# Patient Record
Sex: Male | Born: 1950 | Race: White | Hispanic: No | Marital: Married | Smoking: Never smoker
Health system: Southern US, Community
[De-identification: ages and names within clinical notes are randomized; demographics above are authoritative.]

## PROBLEM LIST (undated history)

## (undated) DIAGNOSIS — I10 Essential (primary) hypertension: Secondary | ICD-10-CM

## (undated) DIAGNOSIS — C189 Malignant neoplasm of colon, unspecified: Secondary | ICD-10-CM

## (undated) DIAGNOSIS — I2510 Atherosclerotic heart disease of native coronary artery without angina pectoris: Secondary | ICD-10-CM

## (undated) DIAGNOSIS — E785 Hyperlipidemia, unspecified: Secondary | ICD-10-CM

## (undated) DIAGNOSIS — I517 Cardiomegaly: Secondary | ICD-10-CM

## (undated) HISTORY — PX: COLECTOMY: SHX59

## (undated) HISTORY — DX: Hyperlipidemia, unspecified: E78.5

## (undated) HISTORY — PX: POLYPECTOMY: SHX149

## (undated) HISTORY — PX: COLONOSCOPY: SHX174

## (undated) HISTORY — DX: Malignant neoplasm of colon, unspecified: C18.9

## (undated) HISTORY — DX: Atherosclerotic heart disease of native coronary artery without angina pectoris: I25.10

---

## 2005-03-22 ENCOUNTER — Ambulatory Visit: Payer: Self-pay | Admitting: Internal Medicine

## 2005-09-28 ENCOUNTER — Ambulatory Visit: Payer: Self-pay | Admitting: Family Medicine

## 2006-07-17 ENCOUNTER — Ambulatory Visit: Payer: Self-pay | Admitting: Internal Medicine

## 2006-07-30 ENCOUNTER — Ambulatory Visit: Payer: Self-pay | Admitting: Internal Medicine

## 2007-01-02 ENCOUNTER — Ambulatory Visit: Payer: Self-pay | Admitting: Gastroenterology

## 2007-01-17 ENCOUNTER — Ambulatory Visit: Payer: Self-pay | Admitting: Gastroenterology

## 2007-01-17 ENCOUNTER — Encounter: Payer: Self-pay | Admitting: Gastroenterology

## 2007-10-21 ENCOUNTER — Ambulatory Visit: Payer: Self-pay | Admitting: Family Medicine

## 2007-10-21 DIAGNOSIS — J309 Allergic rhinitis, unspecified: Secondary | ICD-10-CM | POA: Insufficient documentation

## 2008-06-18 DIAGNOSIS — Z85038 Personal history of other malignant neoplasm of large intestine: Secondary | ICD-10-CM

## 2008-06-18 HISTORY — DX: Personal history of other malignant neoplasm of large intestine: Z85.038

## 2009-01-06 ENCOUNTER — Ambulatory Visit: Payer: Self-pay | Admitting: Internal Medicine

## 2009-01-06 DIAGNOSIS — Z85038 Personal history of other malignant neoplasm of large intestine: Secondary | ICD-10-CM | POA: Insufficient documentation

## 2009-01-06 DIAGNOSIS — E785 Hyperlipidemia, unspecified: Secondary | ICD-10-CM

## 2009-01-06 DIAGNOSIS — L570 Actinic keratosis: Secondary | ICD-10-CM | POA: Insufficient documentation

## 2009-01-08 ENCOUNTER — Encounter: Payer: Self-pay | Admitting: Internal Medicine

## 2009-07-01 ENCOUNTER — Telehealth: Payer: Self-pay | Admitting: Internal Medicine

## 2009-12-09 ENCOUNTER — Encounter (INDEPENDENT_AMBULATORY_CARE_PROVIDER_SITE_OTHER): Payer: Self-pay | Admitting: *Deleted

## 2010-01-24 ENCOUNTER — Encounter (INDEPENDENT_AMBULATORY_CARE_PROVIDER_SITE_OTHER): Payer: Self-pay | Admitting: *Deleted

## 2010-03-13 ENCOUNTER — Telehealth: Payer: Self-pay | Admitting: Gastroenterology

## 2010-04-11 ENCOUNTER — Encounter (INDEPENDENT_AMBULATORY_CARE_PROVIDER_SITE_OTHER): Payer: Self-pay | Admitting: *Deleted

## 2010-05-03 ENCOUNTER — Encounter (INDEPENDENT_AMBULATORY_CARE_PROVIDER_SITE_OTHER): Payer: Self-pay | Admitting: *Deleted

## 2010-05-05 ENCOUNTER — Ambulatory Visit: Payer: Self-pay | Admitting: Gastroenterology

## 2010-05-25 ENCOUNTER — Ambulatory Visit: Payer: Self-pay | Admitting: Gastroenterology

## 2010-05-28 ENCOUNTER — Encounter: Payer: Self-pay | Admitting: Gastroenterology

## 2010-07-20 NOTE — Procedures (Signed)
Summary: Colonoscopy  Patient: James Krueger Note: All result statuses are Final unless otherwise noted.  Tests: (1) Colonoscopy (COL)   COL Colonoscopy           DONE     [facility]     [address]     [place], [place]  [address]           COLONOSCOPY PROCEDURE REPORT     PATIENT:  James Krueger, James Krueger  MR#:  [id number]     BIRTHDATE:  [birth year], 59 yrs. old  GENDER:  male     ENDOSCOPIST:  Judie Petit T. Russella Dar, MD, [facility]           PROCEDURE DATE:  05/25/2010     PROCEDURE:  Colonoscopy with snare polypectomy     ASA CLASS:  Class II     INDICATIONS:  1) surveillance and high-risk screening 2) history     of adenomatous polyps, 2008 3) history of colon cancer: T3, N2,     M0, 1996.     MEDICATIONS:   Fentanyl 50 mcg IV, Versed 6 mg IV     DESCRIPTION OF PROCEDURE:   After the risks benefits and     alternatives of the procedure were thoroughly explained, informed     consent was obtained.  Digital rectal exam was performed and     revealed no abnormalities.   The LB [id number]-[id number] endoscope     was introduced through the anus and advanced to the terminal ileum     which was intubated for a short distance, without limitations.     The quality of the prep was excellent, using MoviPrep.  The     instrument was then slowly withdrawn as the colon was fully     examined.     <<PROCEDUREIMAGES>>     FINDINGS:  The right colon was surgically resected and an     ileo-colonic anastamosis was seen.  The neoterminal ileum appeared     normal.  Two polyps were found in the rectum. They were 4 - 6 mm     in size. Polyps were snared without cautery. Retrieval was     successful. Otherwise normal colonoscopy without other polyps,     masses, vascular ectasias, or inflammatory changes.  Retroflexed     views in the rectum revealed no abnormalities.    The time to     cecum =  1.5  minutes. The scope was then withdrawn (time =  9.67     min) from the patient and the procedure completed.           COMPLICATIONS:  None           ENDOSCOPIC IMPRESSION:     1) Prior right hemi-colectomy     2) 4 - 6 mm Two polyps in the rectum           RECOMMENDATIONS:     1) Await pathology results     2) Repeat Colonoscopy in 3 years.           Venita Lick. Russella Dar, MD, [facility]           CC: Rolm Baptise, MD           n.     Rosalie DoctorVenita Lick. Stark at 05/25/2010 10:03 AM           [name], [id number]  Note: An exclamation mark (!) indicates a result that was not dispersed into the flowsheet.  Document Creation Date: 05/25/2010 10:03 AM _______________________________________________________________________  (1) Order result status: Final Collection or observation date-time: 05/25/2010 09:57 Requested date-time:  Receipt date-time:  Reported date-time:  Referring Physician:   Ordering Physician: Claudette Head [id number]) Specimen Source:  Source: Launa Grill Order Number: [id number] Lab site:   Appended Document: Colonoscopy     Procedures Next Due Date:    Colonoscopy: 05/2013

## 2010-07-20 NOTE — Letter (Signed)
Summary: Colonoscopy Letter  [facility]  [address] [place], [place] [address]   Phone: [phone]  Fax: [phone]      December 09, 2009 MRN: [id number]   James Krueger [address] [place], [place]  [address]   Dear Mr. Odonovan,   According to your medical record, it is time for you to schedule a Colonoscopy. The American Cancer Society recommends this procedure as a method to detect early colon cancer. Patients with a family history of colon cancer, or a personal history of colon polyps or inflammatory bowel disease are at increased risk.  This letter has beeen generated based on the recommendations made at the time of your procedure. If you feel that in your particular situation this may no longer apply, please contact our office.  Please call our office at [phone] to schedule this appointment or to update your records at your earliest convenience.  Thank you for cooperating with [place] to provide you with the very best care possible.   Sincerely,  Judie Petit T. Russella Dar, M.D.  [facility] Gastroenterology Division [phone]

## 2010-07-20 NOTE — Letter (Signed)
Summary: Patient Notice- Polyp Results   Gastroenterology  [address] [place], [place] [address]   Phone: [phone]  Fax: [phone]        May 28, 2010 MRN: [id number]    James Krueger [address] [place], [place]  [address]    Dear [name],  I am pleased to inform you that the colon polyp(s) removed during your recent colonoscopy was (were) found to be benign (no cancer detected) upon pathologic examination.  I recommend you have a repeat colonoscopy examination in 3 years to look for recurrent polyps, as having colon polyps increases your risk for having recurrent polyps or even colon cancer in the future.  Should you develop new or worsening symptoms of abdominal pain, bowel habit changes or bleeding from the rectum or bowels, please schedule an evaluation with either your primary care physician or with me.  Continue treatment plan as outlined the day of your exam.  Please call us if you are having persistent problems or have questions about your condition that have not been fully answered at this time.  Sincerely,  Meryl Dare MD [facility]  This letter has been electronically signed by your physician.  Appended Document: Patient Notice- Polyp Results Letter mailed

## 2010-07-20 NOTE — Progress Notes (Signed)
Summary: Rx Viagra  Phone Note Call from Patient   Caller: Patient Call For: Dr. Alphonsus Sias Summary of Call: Patient called to request Rx for Viagra.  Says he did not request it at the time of his physical on 01/06/2009 but would like to try it now.  Please advise Initial call taken by: Linde Gillis CMA Duncan Dull),  July 01, 2009 9:58 AM  Follow-up for Phone Call        okay to send Rx for viagra 100 #6 x 3 Take 1/2 -1 tab about 30 minutes before sex Follow-up by: Cindee Salt MD,  July 01, 2009 1:21 PM  Additional Follow-up for Phone Call Additional follow up Details #1::        printed rx by mistake, called pt to see which pharmacy rx needs to be sent to, left message on machine at work and home for patient to return call. at  Additional Follow-up by: Mervin Hack CMA Duncan Dull),  July 01, 2009 6:06 PM    Additional Follow-up for Phone Call Additional follow up Details #2::    spoke with patient and sent rx to pharmacy. Follow-up by: Mervin Hack CMA Duncan Dull),  July 04, 2009 8:29 AM  New/Updated Medications: VIAGRA 100 MG TABS (SILDENAFIL CITRATE) Take 1/2 -1 tab about 30 minutes before sex Prescriptions: VIAGRA 100 MG TABS (SILDENAFIL CITRATE) Take 1/2 -1 tab about 30 minutes before sex  #6 x 3   Entered by:   Mervin Hack CMA (AAMA)   Authorized by:   Cindee Salt MD   Signed by:   Mervin Hack CMA (AAMA) on 07/04/2009   Method used:   [facility] [phone]* (retail)       [address] [place], [place]  [address]       Ph: [phone] or [phone]       Fax: [phone]   RxID:   [id number] VIAGRA 100 MG TABS (SILDENAFIL CITRATE) Take 1/2 -1 tab about 30 minutes before sex  #6 x 3   Entered by:   Mervin Hack CMA (AAMA)   Authorized by:   Cindee Salt MD   Signed by:   Mervin Hack CMA (AAMA) on 07/01/2009   Method used:   Print then Give to Patient   RxID:    [id number]

## 2010-07-20 NOTE — Letter (Signed)
Summary: Pre Visit Letter Revised  [facility]  [address] [place], [place] [address]   Phone: [phone]  Fax: [phone]        04/11/2010 MRN: [id number] James Krueger [address] [place], [place]  [address]             Procedure Date:  05/25/2010   Welcome to the Gastroenterology Division at [facility].    You are scheduled to see a nurse for your pre-procedure visit on 05/05/2010 at 2:30PM on the 3rd floor at [facility], [address]  We ask that you try to arrive at our office 15 minutes prior to your appointment time to allow for check-in.  Please take a minute to review the attached form.  If you answer "Yes" to one or more of the questions on the first page, we ask that you call the person listed at your earliest opportunity.  If you answer "No" to all of the questions, please complete the rest of the form and bring it to your appointment.    Your nurse visit will consist of discussing your medical and surgical history, your immediate family medical history, and your medications.   If you are unable to list all of your medications on the form, please bring the medication bottles to your appointment and we will list them.  We will need to be aware of both prescribed and over the counter drugs.  We will need to know exact dosage information as well.    Please be prepared to read and sign documents such as consent forms, a financial agreement, and acknowledgement forms.  If necessary, and with your consent, a friend or relative is welcome to sit-in on the nurse visit with you.  Please bring your insurance card so that we may make a copy of it.  If your insurance requires a referral to see a specialist, please bring your referral form from your primary care physician.  No co-pay is required for this nurse visit.     If you cannot keep your appointment, please call [phone] to cancel or reschedule prior to your appointment date.  This  allows [place] the opportunity to schedule an appointment for another patient in need of care.    Thank you for choosing [facility] for your medical needs.  We appreciate the opportunity to care for you.  Please visit [place] at our website  to learn more about our practice.  Sincerely, The Gastroenterology Division

## 2010-07-20 NOTE — Progress Notes (Signed)
Summary: cost/quote complaint   Phone Note Call from Patient Call back at [phone] (patient's cell)   Caller: wife Call For: Dr. Russella Dar Reason for Call: Talk to Nurse Summary of Call: wife called to sch a COL for her husband in January and to find out her husband's ins coverage for a COL... told her we dont have January's sch right now, but that she could call back in late November to inquire about an appt in January... told her as for her husband's insurance, we do verify with ins as a curtesy after the pt has come in for a previsit or office visit for the COL, however we do recommend that pts contact their ins company themselves to be familiar with their particular policy and coverage benefits... again reiterated to wife that we merely repeat what their ins tells [place] and notify the pt of this as a curtesy... wife said this "wont do" as her husband needs to know an EXACT cost amount to tell his ins company before the end of October... told her a very rough margin is $1,500-3,000 with no complications or removals and in LEC... wife said "lets just say he had no complications or removals, what would the cost be?"... again told her thats what the $1500-3000 quote is... wife then asked to speak to the manager as her husband "has been a pt of Dr. Ardell Isaacs for many many years and this quote is unexceptable"... told her I would be happy to pass  this along to IT consultant, Aurea Graff... wife laughed and asked what was Joan's last name... told pt it is James Krueger... wife then said she would like Aurea Graff to call her husband instead as he is the ins Stage manager Initial call taken by: Vallarie Mare,  March 13, 2010 2:39 PM  Follow-up for Phone Call        Spoke with James Krueger this morning and agreed to have [place] verify benefits for her husband's procedure.  I will call her back afterwards. Follow-up by: James Krueger,  March 21, 2010 2:56 PM  Additional Follow-up for Phone Call Additional follow up  Details #1::        [place] verified benefits with Beth C. at [facility] on 10/4 [phone]).  Pt has a $750 deductible (unmet) after which [employer] will pay 80% up to $3000 out of pocket (zero met).  I called James Krueger around 5pm on 10/4 and gave her the benefit information.  As an example only (I stressed again that we don't know what the final charges will be), if the total charges are $3,000, they would be responsible for the $750 deductible plus 20% of the remaining $2250 balance which is $450 ... total pt responsibility estimate would be $1,200.  Final charges could be more or less than the $3,000 estimate.  James Krueger said that she understood and thanked me for giving her an idea of what they might owe so that they will know how much to put in their healthcare savings/flexible spending a/c for next year.  She also asked if they could pay any balance by installments if they need to and I said that they could.  Her husband is going to wait until January to have his procedure.  I told her to call me if they have any other questions. Additional Follow-up by: James Krueger,  March 22, 2010 8:17 AM

## 2010-07-20 NOTE — Miscellaneous (Signed)
Summary: previsit prep/rm  Clinical Lists Changes  Medications: Added new medication of MOVIPREP 100 GM  SOLR (PEG-KCL-NACL-NASULF-NA ASC-C) As per prep instructions. - Signed Rx of MOVIPREP 100 GM  SOLR (PEG-KCL-NACL-NASULF-NA ASC-C) As per prep instructions.;  #1 x 0;  Signed;  Entered by: Sherren Kerns RN;  Authorized by: Meryl Dare MD [facility];  Method used: [facility] [phone]*, [address] [place], [place], [place]  [address], Ph: [phone] or [phone], Fax: [phone] Observations: Added new observation of ALLERGY REV: Done (05/05/2010 14:25) Added new observation of NKA: T (05/05/2010 14:25)    Prescriptions: MOVIPREP 100 GM  SOLR (PEG-KCL-NACL-NASULF-NA ASC-C) As per prep instructions.  #1 x 0   Entered by:   Sherren Kerns RN   Authorized by:   Meryl Dare MD [facility]   Signed by:   Sherren Kerns RN on 05/05/2010   Method used:   [facility]ron[facility] to        [facility] [phone]* (retail)       [address] [place], [place]  [address]       Ph: [phone] or [phone]       Fax: [phone]   RxID:   [id number]

## 2010-07-20 NOTE — Letter (Signed)
Summary: [facility] Instructions  [facility]  [address] [place], [place] [address]   Phone: [phone]  Fax: [phone]       James Krueger    [birth year]    MRN: [id number]        Procedure Day /Date:  Thursday 05/25/2010     Arrival Time: 8:30 am      Procedure Time: 9:30 am     Location of Procedure:                    _x _  [facility] (4th Floor)                        PREPARATION FOR COLONOSCOPY WITH MOVIPREP   Starting 5 days prior to your procedure Saturday 12/3 do not eat nuts, seeds, popcorn, corn, beans, peas,  salads, or any raw vegetables.  Do not take any fiber supplements (e.g. Metamucil, Citrucel, and Benefiber).  THE DAY BEFORE YOUR PROCEDURE         DATE: Wednesday 12/7  1.  Drink clear liquids the entire day-NO SOLID FOOD  2.  Do not drink anything colored red or purple.  Avoid juices with pulp.  No orange juice.  3.  Drink at least 64 oz. (8 glasses) of fluid/clear liquids during the day to prevent dehydration and help the prep work efficiently.  CLEAR LIQUIDS INCLUDE: Water Jello Ice Popsicles Tea (sugar ok, no milk/cream) Powdered fruit flavored drinks Coffee (sugar ok, no milk/cream) Gatorade Juice: apple, white grape, white cranberry  Lemonade Clear bullion, consomm, broth Carbonated beverages (any kind) Strained chicken noodle soup Hard Candy                             4.  In the morning, mix first dose of MoviPrep solution:    Empty 1 Pouch A and 1 Pouch B into the disposable container    Add lukewarm drinking water to the top line of the container. Mix to dissolve    Refrigerate (mixed solution should be used within 24 hrs)  5.  Begin drinking the prep at 5:00 p.m. The MoviPrep container is divided by 4 marks.   Every 15 minutes drink the solution down to the next mark (approximately 8 oz) until the full liter is complete.   6.  Follow completed prep with 16 oz of clear liquid of your choice  (Nothing red or purple).  Continue to drink clear liquids until bedtime.  7.  Before going to bed, mix second dose of MoviPrep solution:    Empty 1 Pouch A and 1 Pouch B into the disposable container    Add lukewarm drinking water to the top line of the container. Mix to dissolve    Refrigerate  THE DAY OF YOUR PROCEDURE      DATE: Thursday 12/8  Beginning at 4:30 a.m. (5 hours before procedure):         1. Every 15 minutes, drink the solution down to the next mark (approx 8 oz) until the full liter is complete.  2. Follow completed prep with 16 oz. of clear liquid of your choice.    3. You may drink clear liquids until 7:30 am (2 HOURS BEFORE PROCEDURE).   MEDICATION INSTRUCTIONS  Unless otherwise instructed, you should take regular prescription medications with a small sip of water   as early as possible the morning of  your procedure.           OTHER INSTRUCTIONS  You will need a responsible adult at least 59 years of age to accompany you and drive you home.   This person must remain in the waiting room during your procedure.  Wear loose fitting clothing that is easily removed.  Leave jewelry and other valuables at home.  However, you may wish to bring a book to read or  an iPod/MP3 player to listen to music as you wait for your procedure to start.  Remove all body piercing jewelry and leave at home.  Total time from sign-in until discharge is approximately 2-3 hours.  You should go home directly after your procedure and rest.  You can resume normal activities the  day after your procedure.  The day of your procedure you should not:   Drive   Make legal decisions   Operate machinery   Drink alcohol   Return to work  You will receive specific instructions about eating, activities and medications before you leave.    The above instructions have been reviewed and explained to me by   Sherren Kerns RN  May 05, 2010 2:40 PM   I fully understand and  can verbalize these instructions _____________________________ Date _________

## 2010-07-20 NOTE — Letter (Signed)
Summary: Nadara Eaton letter  [place] [facility]  [address] [place], [place] [address]   Phone: [phone]  Fax: [phone]       01/24/2010 MRN: [id number]  RUSTY VILLELLA [address] [place], [place]  [address]  Dear Mr. Dann[facility] [place], and [facility] announce the retirement of Arta Silence, M.D., from full-time practice at the [facility] office effective December 15, 2009 and his plans of returning part-time.  It is important to Dr. Hetty Ely and to our practice that you understand that [facility] has seven physicians in our office for your health care needs.  We will continue to offer the same exceptional care that you have today.    Dr. Hetty Ely has spoken to many of you about his plans for retirement and returning part-time in the fall.   We will continue to work with you through the transition to schedule appointments for you in the office and meet the high standards that [facility] is committed to.   Again, it is with great pleasure that we share the news that Dr. Hetty Ely will return to [facility] in October of 2011 with a reduced schedule.    If you have any questions, or would like to request an appointment with one of our physicians, please call us at [phone] and press the option for Scheduling an appointment.  We take pleasure in providing you with excellent patient care and look forward to seeing you at your next office visit.  Our [facility] Physicians are:  Tillman Abide, M.D. Laurita Quint, M.D. Roxy Manns, M.D. Kerby Nora, M.D. Hannah Beat, M.D. Ruthe Mannan, M.D. We proudly welcomed Raechel Ache, M.D. and Eustaquio Boyden, M.D. to the practice in July/August 2011.  Sincerely,  [facility]

## 2013-03-20 ENCOUNTER — Encounter: Payer: Self-pay | Admitting: Gastroenterology

## 2013-06-08 ENCOUNTER — Encounter: Payer: Self-pay | Admitting: Gastroenterology

## 2013-06-18 DIAGNOSIS — C189 Malignant neoplasm of colon, unspecified: Secondary | ICD-10-CM

## 2013-06-18 HISTORY — DX: Malignant neoplasm of colon, unspecified: C18.9

## 2013-06-26 ENCOUNTER — Encounter: Payer: Self-pay | Admitting: Gastroenterology

## 2013-07-17 ENCOUNTER — Ambulatory Visit (AMBULATORY_SURGERY_CENTER): Payer: 59

## 2013-07-17 VITALS — Ht 67.0 in | Wt 160.0 lb

## 2013-07-17 DIAGNOSIS — Z85038 Personal history of other malignant neoplasm of large intestine: Secondary | ICD-10-CM

## 2013-07-17 MED ORDER — MOVIPREP 100 G PO SOLR
1.0000 | Freq: Once | ORAL | Status: DC
Start: 2013-07-17 — End: 2013-07-31

## 2013-07-17 NOTE — Progress Notes (Signed)
Pt declined emmi registration.  Has had colonoscopies in the past (personal hx of colon CA).

## 2013-07-21 ENCOUNTER — Encounter: Payer: Self-pay | Admitting: Gastroenterology

## 2013-07-31 ENCOUNTER — Ambulatory Visit (AMBULATORY_SURGERY_CENTER): Payer: 59 | Admitting: Gastroenterology

## 2013-07-31 ENCOUNTER — Encounter: Payer: Self-pay | Admitting: Gastroenterology

## 2013-07-31 ENCOUNTER — Telehealth: Payer: Self-pay | Admitting: Genetic Counselor

## 2013-07-31 ENCOUNTER — Other Ambulatory Visit: Payer: Self-pay

## 2013-07-31 VITALS — BP 130/80 | HR 67 | Temp 98.3°F | Resp 17 | Ht 67.0 in | Wt 160.0 lb

## 2013-07-31 DIAGNOSIS — Z86010 Personal history of colonic polyps: Secondary | ICD-10-CM

## 2013-07-31 DIAGNOSIS — Z85038 Personal history of other malignant neoplasm of large intestine: Secondary | ICD-10-CM

## 2013-07-31 DIAGNOSIS — K6389 Other specified diseases of intestine: Secondary | ICD-10-CM

## 2013-07-31 DIAGNOSIS — D126 Benign neoplasm of colon, unspecified: Secondary | ICD-10-CM

## 2013-07-31 MED ORDER — SODIUM CHLORIDE 0.9 % IV SOLN: 500.0000 mL | INTRAVENOUS | Status: DC

## 2013-07-31 NOTE — Telephone Encounter (Signed)
CALLED PATIENT TO SCHEDULE GENETIC APPT NOT ABLE TO LEAVE MESSAGE.  °

## 2013-07-31 NOTE — Patient Instructions (Signed)
YOU HAD AN ENDOSCOPIC PROCEDURE TODAY AT THE [facility]: Refer to the procedure report that was given to you for any specific questions about what was found during the examination.  If the procedure report does not answer your questions, please call your gastroenterologist to clarify.  If you requested that your care partner not be given the details of your procedure findings, then the procedure report has been included in a sealed envelope for you to review at your convenience later.  YOU SHOULD EXPECT: Some feelings of bloating in the abdomen. Passage of more gas than usual.  Walking can help get rid of the air that was put into your GI tract during the procedure and reduce the bloating. If you had a lower endoscopy (such as a colonoscopy or flexible sigmoidoscopy) you may notice spotting of blood in your stool or on the toilet paper. If you underwent a bowel prep for your procedure, then you may not have a normal bowel movement for a few days.  DIET: Your first meal following the procedure should be a light meal and then it is ok to progress to your normal diet.  A half-sandwich or bowl of soup is an example of a good first meal.  Heavy or fried foods are harder to digest and may make you feel nauseous or bloated.  Likewise meals heavy in dairy and vegetables can cause extra gas to form and this can also increase the bloating.  Drink plenty of fluids but you should avoid alcoholic beverages for 24 hours.  ACTIVITY: Your care partner should take you home directly after the procedure.  You should plan to take it easy, moving slowly for the rest of the day.  You can resume normal activity the day after the procedure however you should NOT DRIVE or use heavy machinery for 24 hours (because of the sedation medicines used during the test).    SYMPTOMS TO REPORT IMMEDIATELY: A gastroenterologist can be reached at any hour.  During normal business hours, 8:30 AM to 5:00 PM Monday through Friday,  call [phone].  After hours and on weekends, please call the GI answering service at [phone] who will take a message and have the physician on call contact you.   Following lower endoscopy (colonoscopy or flexible sigmoidoscopy):  Excessive amounts of blood in the stool  Significant tenderness or worsening of abdominal pains  Swelling of the abdomen that is new, acute  Fever of 100F or higher  FOLLOW UP: If any biopsies were taken you will be contacted by phone or by letter within the next 1-3 weeks.  Call your gastroenterologist if you have not heard about the biopsies in 3 weeks.  Our staff will call the home number listed on your records the next business day following your procedure to check on you and address any questions or concerns that you may have at that time regarding the information given to you following your procedure. This is a courtesy call and so if there is no answer at the home number and we have not heard from you through the emergency physician on call, we will assume that you have returned to your regular daily activities without incident.  SIGNATURES/CONFIDENTIALITY: You and/or your care partner have signed paperwork which will be entered into your electronic medical record.  These signatures attest to the fact that that the information above on your After Visit Summary has been reviewed and is understood.  Full responsibility of the confidentiality of this   discharge information lies with you and/or your care-partner.  Polyps, hemorrhoids-handouts given  Office will call with appointment for CT scan of chest, abd, and pelvis.  Office will also arrange for you to meet with a surgeon and for genetic testing.  Colonoscopy in 1 year.

## 2013-07-31 NOTE — Op Note (Signed)
[place]  [address]. [place], [address]   COLONOSCOPY PROCEDURE REPORT  PATIENT: James Krueger, James Krueger  MR#: [id number] BIRTHDATE: 05/09/1951 , 62  yrs. old GENDER: Male ENDOSCOPIST: Ladene Artist, MD, [facility] PROCEDURE DATE:  07/31/2013 PROCEDURE:   Colonoscopy with biopsy, snare polypectomy and submucosal tattoo injection First Screening Colonoscopy - Avg.  risk and is 50 yrs.  old or older - No.  Prior Negative Screening - Now for repeat screening. N/A  History of Adenoma - Now for follow-up colonoscopy & has been > or = to 3 yrs.  N/A  Polyps Removed Today? Yes. ASA CLASS:   Class II INDICATIONS:High risk patient with personal history of colon cancer, T3, N2 in 1996, and Patient's personal history of adenomatous colon polyps. MEDICATIONS: MAC sedation, administered by CRNA, Propofol (Diprivan), and propofol (Diprivan) 350mg  IV DESCRIPTION OF PROCEDURE:   After the risks benefits and alternatives of the procedure were thoroughly explained, informed consent was obtained.  A digital rectal exam revealed no abnormalities of the rectum.   The LB [id number]  endoscope was introduced through the anus and advanced to the surgical anastomosis. No adverse events experienced.   The quality of the prep was excellent, using MoviPrep  The instrument was then slowly withdrawn as the colon was fully examined.  COLON FINDINGS: A firm and ulcerated mass measuring 2.5 X 2cm in size was found in the ascending colon adjacent to the anastomosis. Multiple biopsies of the lesion were performed.  A tattoo was applied proximally and distally.  A sessile polyp measuring 7 mm in size was found in the ascending colon at the edge of the anastomosis. It was not removed. Multiple biopsies of the lesion were performed.  A sessile polyp measuring 2.5 cm in size was found in the sigmoid colon.  A piecemeal polypectomy was performed using snare cautery. The resection was appeared to be  complete and the polyp tissue was completely retrieved. A tattoo was applied proximally and distally.  There was evidence of a prior ileocolonic surgical anastomosis.  The colon was otherwise normal.  There was no diverticulosis, inflammation, polyps or cancers unless previously stated.  Retroflexed views revealed small internal hemorrhoids. The time to cecum=2 minutes 22 seconds. Withdrawal time=22 minutes 03 seconds. The scope was withdrawn and the procedure completed. COMPLICATIONS: There were no complications.  ENDOSCOPIC IMPRESSION: 1.   Mass measuring 2.5 X 2 cm in the ascending colon; multiple biopsies performed; a tattoo was applied 2.   Sessile polyp measuring 7 mm in the ascending colon; multiple biopsies performed 3.   Sessile polyp measuring 2.5 cm in the sigmoid colon; piecemeal polypectomy performed using snare cautery; a tattoo was applied 4.   Prior ileocolonic surgical anastomosis 5.   Small internal hemorrhoids  RECOMMENDATIONS: 1.  Await pathology results 2.  My office will arrange for you to have a CT scan of chest, abdomen and pelvis. 3.  My office will arrange for you to meet with a surgeon and for genetic testing. 4.  Colonoscopy in 1 year   eSigned:  Ladene Artist, MD, [facility] 07/31/2013 2:47 PM      PATIENT NAME:  James Krueger, James Krueger MR#: [id number]

## 2013-07-31 NOTE — Progress Notes (Signed)
Called to room to assist during endoscopic procedure.  Patient ID and intended procedure confirmed with present staff. Received instructions for my participation in the procedure from the performing physician.  

## 2013-08-03 ENCOUNTER — Telehealth: Payer: Self-pay | Admitting: *Deleted

## 2013-08-03 NOTE — Telephone Encounter (Signed)
  Follow up Call-  Call back number 07/31/2013  Post procedure Call Back phone  # [phone]  Permission to leave phone message Yes     Patient questions:  Do you have a fever, pain , or abdominal swelling? no Pain Score  0 *  Have you tolerated food without any problems? yes  Have you been able to return to your normal activities? yes  Do you have any questions about your discharge instructions: Diet   no Medications  no Follow up visit  no  Do you have questions or concerns about your Care? no  Actions: * If pain score is 4 or above: No action needed, pain <4.  Pt. Just wondered why smaller polyp was not removed,but he stated that if he had surgery than that polyp will be removed anyway.

## 2013-08-04 ENCOUNTER — Other Ambulatory Visit: Payer: 59

## 2013-08-06 ENCOUNTER — Ambulatory Visit (INDEPENDENT_AMBULATORY_CARE_PROVIDER_SITE_OTHER)
Admission: RE | Admit: 2013-08-06 | Discharge: 2013-08-06 | Disposition: A | Discharge status: Home / Self Care | Payer: 59 | Source: Ambulatory Visit | Attending: Gastroenterology | Admitting: Gastroenterology

## 2013-08-06 DIAGNOSIS — K6389 Other specified diseases of intestine: Secondary | ICD-10-CM

## 2013-08-06 MED ORDER — IOHEXOL 300 MG/ML  SOLN
100.0000 mL | Freq: Once | INTRAMUSCULAR | Status: AC | PRN
  Administered 2013-08-06: 100 mL via INTRAVENOUS

## 2013-08-07 ENCOUNTER — Telehealth: Payer: Self-pay | Admitting: Genetic Counselor

## 2013-08-07 ENCOUNTER — Telehealth: Payer: Self-pay | Admitting: Oncology

## 2013-08-07 ENCOUNTER — Telehealth: Payer: Self-pay | Admitting: Gastroenterology

## 2013-08-07 NOTE — Telephone Encounter (Signed)
PATIENT DECLINE APPT FOR GENETIC COUSELING

## 2013-08-07 NOTE — Telephone Encounter (Signed)
CALLED PATIENT TO SCHEDULE GENETIC APPT NPT ABLE TO LEAVE MESSAGE ON [phone].

## 2013-08-07 NOTE — Telephone Encounter (Signed)
I spoke with his wife and gave her the report that the CT scan was negative.

## 2013-08-11 ENCOUNTER — Ambulatory Visit (INDEPENDENT_AMBULATORY_CARE_PROVIDER_SITE_OTHER): Payer: 59 | Admitting: Surgery

## 2013-08-11 ENCOUNTER — Encounter (INDEPENDENT_AMBULATORY_CARE_PROVIDER_SITE_OTHER): Payer: Self-pay | Admitting: Surgery

## 2013-08-11 VITALS — BP 120/80 | HR 80 | Temp 98.6°F | Resp 16 | Ht 67.0 in | Wt 164.6 lb

## 2013-08-11 DIAGNOSIS — C189 Malignant neoplasm of colon, unspecified: Secondary | ICD-10-CM | POA: Insufficient documentation

## 2013-08-11 NOTE — Progress Notes (Signed)
General Surgery [facility]  Chief Complaint  Patient presents with  . New Evaluation    colon cancer - referral from Dr. Lucio Edward    HISTORY: Patient is a 63 year old male referred by his gastroenterologist for newly diagnosed adenocarcinoma of the colon.  Patient has a history of prior adenocarcinoma of the colon for which he underwent right colectomy 19 years ago. He received adjuvant chemotherapy. He has had close followup with regular colonoscopic examinations. His most recent study from 11 days ago shows a 2.5 cm tumor just distal to his prior anastomosis. Biopsies show invasive adenocarcinoma. 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 There was also a tubulovillous adenoma in the sigmoid colon which was endoscopically resected.  Patient has had no symptoms. He denies any bleeding per rectum. He denies any pain.  Past Medical History  Diagnosis Date  . Colon cancer     No current outpatient prescriptions on file.   No current facility-administered medications for this visit.    No Known Allergies  Family History  Problem Relation Age of Onset  . Colon cancer Father   . Colon cancer Brother   . Pancreatic cancer Neg Hx   . Stomach cancer Neg Hx     History   Social History  . Marital Status: Married    Spouse Name: N/A    Number of Children: N/A  . Years of Education: N/A   Social History Main Topics  . Smoking status: Never Smoker   . Smokeless tobacco: Never Used  . Alcohol Use: No  . Drug Use: No  . Sexual Activity: None   Other Topics Concern  . None   Social History Narrative  . None    REVIEW OF SYSTEMS - PERTINENT POSITIVES ONLY: Denies bleeding per rectum. Denies pain. No sign of abdominal wall hernia.  EXAM: Filed Vitals:   08/11/13 1402  BP: 120/80  Pulse: 80  Temp: 98.6 F (37 C)  Resp: 16    GENERAL: well-developed, well-nourished, no acute distress HEENT: normocephalic;  pupils equal and reactive; sclerae clear; dentition good; mucous membranes moist NECK:  symmetric on extension; no palpable anterior or posterior cervical lymphadenopathy; no supraclavicular masses; no tenderness CHEST: clear to auscultation bilaterally without rales, rhonchi, or wheezes CARDIAC: regular rate and rhythm without significant murmur; peripheral pulses are full ABDOMEN: soft without distension; bowel sounds present; no mass; no hepatosplenomegaly; no hernia; well-healed midline surgical incision EXT:  non-tender without edema; no deformity NEURO: no gross focal deficits; no sign of tremor   LABORATORY RESULTS: See [facility] (CHL-Epic) for most recent results  RADIOLOGY RESULTS: See [facility] (CHL-Epic) for most recent results  IMPRESSION: Adenocarcinoma of the colon, metachronous  PLAN: I had a lengthy discussion with the patient and his wife regarding these findings. We reviewed options for treatment. I have recommended open laparotomy with resection of distal small bowel, anastomosis, and proximal transverse colon with primary anastomosis. We discussed risk and benefits of the procedure. We discussed the hospital stay to be anticipated in his postoperative recovery and returned activity. They understand and wish to proceed.  The risks and benefits of the procedure have been discussed at length with the patient.  The patient understands the proposed procedure, potential alternative treatments, and the course of recovery to be expected.  All of the patient's questions have been answered at this time.  The patient wishes to proceed with surgery.  [name], MD, FACS General &  Endocrine Surgery [facility]  Primary Care Physician: No PCP Per Patient

## 2013-08-11 NOTE — Patient Instructions (Signed)
[facility]  OPEN ABDOMINAL SURGERY: POST OP INSTRUCTIONS  Always review your discharge instruction sheet given to you by the facility where your surgery was performed.  1. A prescription for pain medication may be given to you upon discharge.  Take your pain medication as prescribed.  If narcotic pain medicine is not needed, then you may take acetaminophen (Tylenol) or ibuprofen (Advil) as needed. 2. Take your usually prescribed medications unless otherwise directed. 3. If you need a refill on your pain medication, please contact your pharmacy. They will contact our office to request authorization.  Prescriptions will not be filled after 5 pm or on weekends. 4. You should follow a light diet the first few days after arrival home, such as soup and crackers, unless your doctor has advised otherwise. A high-fiber, low fat diet can be resumed as tolerated.  Be sure to include plenty of fluids daily.  5. Most patients will experience some swelling and bruising in the area of the incision. Ice packs will help. Swelling and bruising can take several days to resolve. 6. It is common to experience some constipation if taking pain medication after surgery.  Increasing fluid intake and taking a stool softener will usually help or prevent this problem from occurring.  A mild laxative (Milk of Magnesia or Miralax) should be taken according to package directions if there are no bowel movements after 48 hours. 7.  You may have steri-strips (small skin tapes) in place directly over the incision.  These strips should be left on the skin for 7-10 days.  If your surgeon used skin glue on the incision, you may shower in 24 hours.  The glue will flake off over the next 2-3 weeks.  Any sutures or staples will be removed at the office during your follow-up visit. You may find that a light gauze bandage over your incision may keep your staples from being rubbed or pulled. You may shower and replace the bandage  daily. 8. ACTIVITIES:  You may resume regular (light) daily activities beginning the next day-such as daily self-care, walking, climbing stairs-gradually increasing activities as tolerated.  You may have sexual intercourse when it is comfortable.  Refrain from any heavy lifting or straining until approved by your doctor.  You may drive when you no longer are taking prescription pain medication, you can comfortably wear a seatbelt, and you can safely maneuver your car and apply brakes. 9. You should see your doctor in the office for a follow-up appointment approximately two weeks after your surgery.  Make sure that you call for this appointment within a day or two after you arrive home to insure a convenient appointment time.  WHEN TO CALL YOUR DOCTOR: 1. Fever greater than 101.0 2. Inability to urinate 3. Persistent nausea and/or vomiting 4. Extreme swelling or bruising 5. Continued bleeding from incision 6. Increased pain, redness, or drainage from the incision 7. Difficulty swallowing or breathing 8. Muscle cramping or spasms 9. Numbness or tingling in hands or around lips  IF YOU HAVE DISABILITY OR FAMILY LEAVE FORMS, YOU MUST BRING THEM TO THE OFFICE FOR PROCESSING.  PLEASE DO NOT GIVE THEM TO YOUR DOCTOR.  The clinic staff is available to answer your questions during regular business hours.  Please don't hesitate to call and ask to speak to one of the nurses if you have concerns.  [facility] [place] Office: [phone]  For further questions, please visit [email or web address]

## 2013-08-13 ENCOUNTER — Other Ambulatory Visit (HOSPITAL_COMMUNITY): Payer: Self-pay | Admitting: Surgery

## 2013-08-13 NOTE — Patient Instructions (Addendum)
[name]  08/13/2013   Your procedure is scheduled on: Wednesday March 4th  Report to [facility] at 630 AM.  Call this number if you have problems the morning of surgery [phone]   Remember: follow all bowel prep instructions from dr gerkin  Do not eat food or drink liquids :After Midnight.     Take these medicines the morning of surgery with A SIP OF WATER: none                                SEE [place] PREPARING FOR SURGERY SHEET             You may not have any metal on your body including hair pins and piercings  Do not wear jewelry, make-up.  Do not wear lotions, powders, or perfumes. No  Deodorant is to be worn.   Men may shave face and neck.  Do not bring valuables to the hospital. [place].  Contacts, dentures or bridgework may not be worn into surgery.  Leave suitcase in the car. After surgery it may be brought to your room.  For patients admitted to the hospital, checkout time is 11:00 AM the day of discharge.   Please read over the following fact sheets that you were given: [facility] Preparing for surgery sheet,  blood fact sheet    FAILURE TO [name].  PATIENT SIGNATURE___________________________________________  NURSE SIGNATURE_____________________________________________

## 2013-08-13 NOTE — Progress Notes (Signed)
Chest ct 08-06-13 epic 

## 2013-08-14 ENCOUNTER — Encounter (HOSPITAL_COMMUNITY): Payer: Self-pay | Admitting: Pharmacy Technician

## 2013-08-17 ENCOUNTER — Encounter (HOSPITAL_COMMUNITY): Payer: Self-pay

## 2013-08-17 ENCOUNTER — Encounter (HOSPITAL_COMMUNITY)
Admission: RE | Admit: 2013-08-17 | Discharge: 2013-08-17 | Disposition: A | Discharge status: Home / Self Care | Payer: 59 | Source: Ambulatory Visit | Attending: Surgery | Admitting: Surgery

## 2013-08-17 LAB — CBC
HCT: 39 % (ref 39.0–52.0)
HEMOGLOBIN: 12.7 g/dL — AB (ref 13.0–17.0)
MCH: 28.8 pg (ref 26.0–34.0)
MCHC: 32.6 g/dL (ref 30.0–36.0)
MCV: 88.4 fL (ref 78.0–100.0)
PLATELETS: 267 10*3/uL (ref 150–400)
RBC: 4.41 MIL/uL (ref 4.22–5.81)
RDW: 12.4 % (ref 11.5–15.5)
WBC: 7.2 10*3/uL (ref 4.0–10.5)

## 2013-08-17 LAB — ABO/RH: ABO/RH(D): O POS

## 2013-08-17 NOTE — Progress Notes (Signed)
Quick Note:  These results are acceptable for scheduled surgery.  Tarah Buboltz M. Linn Goetze, MD, FACS [facility] Office: [phone]   ______ 

## 2013-08-17 NOTE — Progress Notes (Signed)
Chest ct 08-06-13 epic

## 2013-08-18 NOTE — Anesthesia Preprocedure Evaluation (Addendum)
Anesthesia Evaluation  Patient identified by MRN, date of birth, ID band Patient awake    Reviewed: Allergy & Precautions, H&P , NPO status , Patient's Chart, lab work & pertinent test results  Airway Mallampati: II  TM Distance: >3 FB Neck ROM: full    Dental no notable dental hx. (+) Teeth Intact, Dental Advisory Given   Pulmonary neg pulmonary ROS,  breath sounds clear to auscultation  Pulmonary exam normal       Cardiovascular Exercise Tolerance: Good negative cardio ROS  Rhythm:regular Rate:Normal     Neuro/Psych negative neurological ROS  negative psych ROS   GI/Hepatic negative GI ROS, Neg liver ROS,   Endo/Other  negative endocrine ROS  Renal/GU negative Renal ROS  negative genitourinary   Musculoskeletal   Abdominal   Peds  Hematology negative hematology ROS (+)   Anesthesia Other Findings   Reproductive/Obstetrics negative OB ROS                            Anesthesia Physical Anesthesia Plan  ASA: II  Anesthesia Plan: General   Post-op Pain Management:    Induction: Intravenous  Airway Management Planned: Oral ETT  Additional Equipment:   Intra-op Plan:   Post-operative Plan: Extubation in OR  Informed Consent: I have reviewed the patients History and Physical, chart, labs and discussed the procedure including the risks, benefits and alternatives for the proposed anesthesia with the patient or authorized representative who has indicated his/her understanding and acceptance.   Dental Advisory Given  Plan Discussed with: CRNA and Surgeon  Anesthesia Plan Comments:        Anesthesia Quick Evaluation  

## 2013-08-19 ENCOUNTER — Encounter (HOSPITAL_COMMUNITY)
Admission: RE | Disposition: A | Discharge status: Home / Self Care | Payer: Self-pay | Source: Ambulatory Visit | Attending: Surgery

## 2013-08-19 ENCOUNTER — Encounter (HOSPITAL_COMMUNITY): Payer: 59 | Admitting: Anesthesiology

## 2013-08-19 ENCOUNTER — Inpatient Hospital Stay (HOSPITAL_COMMUNITY)
Admission: RE | Admit: 2013-08-19 | Discharge: 2013-08-23 | DRG: 330 | Disposition: A | Discharge status: Home / Self Care | Payer: 59 | Source: Ambulatory Visit | Attending: Surgery | Admitting: Surgery

## 2013-08-19 ENCOUNTER — Encounter (HOSPITAL_COMMUNITY): Payer: Self-pay | Admitting: *Deleted

## 2013-08-19 ENCOUNTER — Inpatient Hospital Stay (HOSPITAL_COMMUNITY): Payer: 59 | Admitting: Anesthesiology

## 2013-08-19 DIAGNOSIS — C189 Malignant neoplasm of colon, unspecified: Secondary | ICD-10-CM

## 2013-08-19 DIAGNOSIS — Z800 Family history of malignant neoplasm of digestive organs: Secondary | ICD-10-CM

## 2013-08-19 DIAGNOSIS — Z01812 Encounter for preprocedural laboratory examination: Secondary | ICD-10-CM

## 2013-08-19 DIAGNOSIS — K560 Paralytic ileus: Secondary | ICD-10-CM | POA: Diagnosis not present

## 2013-08-19 DIAGNOSIS — Z85038 Personal history of other malignant neoplasm of large intestine: Secondary | ICD-10-CM

## 2013-08-19 HISTORY — PX: PARTIAL COLECTOMY: SHX5273

## 2013-08-19 LAB — CREATININE, SERUM: CREATININE: 0.84 mg/dL (ref 0.50–1.35)

## 2013-08-19 LAB — CBC
HEMATOCRIT: 36 % — AB (ref 39.0–52.0)
HEMOGLOBIN: 12.4 g/dL — AB (ref 13.0–17.0)
MCH: 29.3 pg (ref 26.0–34.0)
MCHC: 34.4 g/dL (ref 30.0–36.0)
MCV: 85.1 fL (ref 78.0–100.0)
Platelets: 263 10*3/uL (ref 150–400)
RBC: 4.23 MIL/uL (ref 4.22–5.81)
RDW: 12.2 % (ref 11.5–15.5)
WBC: 17.6 10*3/uL — AB (ref 4.0–10.5)

## 2013-08-19 LAB — TYPE AND SCREEN
ABO/RH(D): O POS
ANTIBODY SCREEN: NEGATIVE

## 2013-08-19 SURGERY — COLECTOMY, PARTIAL
Anesthesia: General | Site: Abdomen

## 2013-08-19 MED ORDER — FENTANYL CITRATE 0.05 MG/ML IJ SOLN
INTRAMUSCULAR | Status: AC
  Filled 2013-08-19: qty 2

## 2013-08-19 MED ORDER — ONDANSETRON HCL 4 MG PO TABS: 4.0000 mg | ORAL_TABLET | Freq: Four times a day (QID) | ORAL | Status: DC | PRN

## 2013-08-19 MED ORDER — KCL IN DEXTROSE-NACL 20-5-0.45 MEQ/L-%-% IV SOLN
INTRAVENOUS | Status: AC
  Filled 2013-08-19: qty 1000

## 2013-08-19 MED ORDER — PHENYLEPHRINE 40 MCG/ML (10ML) SYRINGE FOR IV PUSH (FOR BLOOD PRESSURE SUPPORT)
PREFILLED_SYRINGE | INTRAVENOUS | Status: AC
  Filled 2013-08-19: qty 10

## 2013-08-19 MED ORDER — SUCCINYLCHOLINE CHLORIDE 20 MG/ML IJ SOLN
INTRAMUSCULAR | Status: DC | PRN
  Administered 2013-08-19: 100 mg via INTRAVENOUS

## 2013-08-19 MED ORDER — SODIUM CHLORIDE 0.9 % IJ SOLN: 9.0000 mL | INTRAMUSCULAR | Status: DC | PRN

## 2013-08-19 MED ORDER — KETAMINE HCL 10 MG/ML IJ SOLN
INTRAMUSCULAR | Status: AC
  Filled 2013-08-19: qty 1

## 2013-08-19 MED ORDER — FENTANYL CITRATE 0.05 MG/ML IJ SOLN
INTRAMUSCULAR | Status: AC
  Filled 2013-08-19: qty 5

## 2013-08-19 MED ORDER — ONDANSETRON HCL 4 MG/2ML IJ SOLN
INTRAMUSCULAR | Status: DC | PRN
  Administered 2013-08-19: 2 mg via INTRAVENOUS

## 2013-08-19 MED ORDER — GLYCOPYRROLATE 0.2 MG/ML IJ SOLN
INTRAMUSCULAR | Status: AC
  Filled 2013-08-19: qty 2

## 2013-08-19 MED ORDER — PROPOFOL 10 MG/ML IV BOLUS
INTRAVENOUS | Status: AC
  Filled 2013-08-19: qty 20

## 2013-08-19 MED ORDER — ACETAMINOPHEN 10 MG/ML IV SOLN
1000.0000 mg | Freq: Once | INTRAVENOUS | Status: DC
  Filled 2013-08-19: qty 100

## 2013-08-19 MED ORDER — SODIUM CHLORIDE 0.9 % IV SOLN
1.0000 g | INTRAVENOUS | Status: AC
  Administered 2013-08-19: 1 g via INTRAVENOUS
  Filled 2013-08-19: qty 1

## 2013-08-19 MED ORDER — KCL IN DEXTROSE-NACL 20-5-0.45 MEQ/L-%-% IV SOLN
INTRAVENOUS | Status: DC
Start: 2013-08-19 — End: 2013-08-23
  Administered 2013-08-19: 14:00:00 via INTRAVENOUS
  Administered 2013-08-20: 1 mL via INTRAVENOUS
  Administered 2013-08-20 (×2): via INTRAVENOUS
  Administered 2013-08-21: 1 mL via INTRAVENOUS
  Administered 2013-08-21: 14:00:00 via INTRAVENOUS
  Administered 2013-08-21: 1 mL via INTRAVENOUS
  Administered 2013-08-22: 08:00:00 via INTRAVENOUS
  Filled 2013-08-19 (×8): qty 1000

## 2013-08-19 MED ORDER — MIDAZOLAM HCL 2 MG/2ML IJ SOLN
INTRAMUSCULAR | Status: AC
  Filled 2013-08-19: qty 2

## 2013-08-19 MED ORDER — BIOTENE DRY MOUTH MT LIQD
15.0000 mL | Freq: Two times a day (BID) | OROMUCOSAL | Status: DC
  Administered 2013-08-19 – 2013-08-22 (×5): 15 mL via OROMUCOSAL

## 2013-08-19 MED ORDER — HYDROMORPHONE 0.3 MG/ML IV SOLN
INTRAVENOUS | Status: AC
  Filled 2013-08-19: qty 25

## 2013-08-19 MED ORDER — SODIUM CHLORIDE 0.9 % IJ SOLN
INTRAMUSCULAR | Status: AC
  Filled 2013-08-19: qty 10

## 2013-08-19 MED ORDER — LIDOCAINE HCL (CARDIAC) 20 MG/ML IV SOLN
INTRAVENOUS | Status: AC
  Filled 2013-08-19: qty 10

## 2013-08-19 MED ORDER — PHENYLEPHRINE HCL 10 MG/ML IJ SOLN
INTRAMUSCULAR | Status: DC | PRN
  Administered 2013-08-19: 20 ug via INTRAVENOUS
  Administered 2013-08-19: 40 ug via INTRAVENOUS

## 2013-08-19 MED ORDER — LACTATED RINGERS IV SOLN: INTRAVENOUS | Status: DC

## 2013-08-19 MED ORDER — NALOXONE HCL 0.4 MG/ML IJ SOLN: 0.4000 mg | INTRAMUSCULAR | Status: DC | PRN

## 2013-08-19 MED ORDER — HYDROMORPHONE HCL PF 1 MG/ML IJ SOLN
INTRAMUSCULAR | Status: AC
  Filled 2013-08-19: qty 1

## 2013-08-19 MED ORDER — DEXAMETHASONE SODIUM PHOSPHATE 10 MG/ML IJ SOLN
INTRAMUSCULAR | Status: AC
  Filled 2013-08-19: qty 1

## 2013-08-19 MED ORDER — ONDANSETRON HCL 4 MG/2ML IJ SOLN: 4.0000 mg | Freq: Four times a day (QID) | INTRAMUSCULAR | Status: DC | PRN

## 2013-08-19 MED ORDER — CISATRACURIUM BESYLATE 20 MG/10ML IV SOLN
INTRAVENOUS | Status: AC
  Filled 2013-08-19: qty 10

## 2013-08-19 MED ORDER — ONDANSETRON HCL 4 MG/2ML IJ SOLN
4.0000 mg | Freq: Four times a day (QID) | INTRAMUSCULAR | Status: DC | PRN
  Administered 2013-08-20: 4 mg via INTRAVENOUS
  Filled 2013-08-19: qty 2

## 2013-08-19 MED ORDER — DIPHENHYDRAMINE HCL 50 MG/ML IJ SOLN: 12.5000 mg | Freq: Four times a day (QID) | INTRAMUSCULAR | Status: DC | PRN

## 2013-08-19 MED ORDER — LIDOCAINE HCL (CARDIAC) 20 MG/ML IV SOLN
INTRAVENOUS | Status: DC | PRN
  Administered 2013-08-19: 30 mg via INTRAVENOUS

## 2013-08-19 MED ORDER — MIDAZOLAM HCL 5 MG/5ML IJ SOLN
INTRAMUSCULAR | Status: DC | PRN
  Administered 2013-08-19: 2 mg via INTRAVENOUS

## 2013-08-19 MED ORDER — FENTANYL CITRATE 0.05 MG/ML IJ SOLN
INTRAMUSCULAR | Status: DC | PRN
  Administered 2013-08-19: 25 ug via INTRAVENOUS
  Administered 2013-08-19: 50 ug via INTRAVENOUS
  Administered 2013-08-19 (×3): 25 ug via INTRAVENOUS
  Administered 2013-08-19 (×2): 50 ug via INTRAVENOUS
  Administered 2013-08-19 (×2): 25 ug via INTRAVENOUS

## 2013-08-19 MED ORDER — NEOSTIGMINE METHYLSULFATE 1 MG/ML IJ SOLN
INTRAMUSCULAR | Status: DC | PRN
  Administered 2013-08-19: 40 mg via INTRAVENOUS

## 2013-08-19 MED ORDER — HYDROMORPHONE 0.3 MG/ML IV SOLN
INTRAVENOUS | Status: DC
  Administered 2013-08-19: 1.2 mg via INTRAVENOUS
  Administered 2013-08-19: 0.3 mg via INTRAVENOUS
  Administered 2013-08-19: 12:00:00 via INTRAVENOUS
  Administered 2013-08-19: 0.6 mg via INTRAVENOUS
  Administered 2013-08-19 (×3): 0.3 mg via INTRAVENOUS
  Administered 2013-08-20 (×2): 0.6 mg via INTRAVENOUS
  Administered 2013-08-20: 0.3 mg via INTRAVENOUS

## 2013-08-19 MED ORDER — CISATRACURIUM BESYLATE (PF) 10 MG/5ML IV SOLN
INTRAVENOUS | Status: DC | PRN
  Administered 2013-08-19: 8 mg via INTRAVENOUS
  Administered 2013-08-19: 6 mg via INTRAVENOUS
  Administered 2013-08-19: 3 mg via INTRAVENOUS

## 2013-08-19 MED ORDER — ENOXAPARIN SODIUM 40 MG/0.4ML ~~LOC~~ SOLN
40.0000 mg | Freq: Every day | SUBCUTANEOUS | Status: DC
  Administered 2013-08-20 – 2013-08-22 (×3): 40 mg via SUBCUTANEOUS
  Filled 2013-08-19 (×4): qty 0.4

## 2013-08-19 MED ORDER — KETAMINE HCL 10 MG/ML IJ SOLN
INTRAMUSCULAR | Status: DC | PRN
  Administered 2013-08-19: 5 mg via INTRAVENOUS
  Administered 2013-08-19: 10 mg via INTRAVENOUS
  Administered 2013-08-19 (×2): 5 mg via INTRAVENOUS

## 2013-08-19 MED ORDER — EPHEDRINE SULFATE 50 MG/ML IJ SOLN
INTRAMUSCULAR | Status: AC
Start: 2013-08-19 — End: 2013-08-19
  Filled 2013-08-19: qty 1

## 2013-08-19 MED ORDER — DIPHENHYDRAMINE HCL 12.5 MG/5ML PO ELIX: 12.5000 mg | ORAL_SOLUTION | Freq: Four times a day (QID) | ORAL | Status: DC | PRN

## 2013-08-19 MED ORDER — LACTATED RINGERS IV SOLN
INTRAVENOUS | Status: DC | PRN
  Administered 2013-08-19 (×2): via INTRAVENOUS

## 2013-08-19 MED ORDER — PROPOFOL 10 MG/ML IV BOLUS
INTRAVENOUS | Status: DC | PRN
  Administered 2013-08-19: 150 mg via INTRAVENOUS

## 2013-08-19 MED ORDER — 0.9 % SODIUM CHLORIDE (POUR BTL) OPTIME
TOPICAL | Status: DC | PRN
  Administered 2013-08-19: 2000 mL

## 2013-08-19 MED ORDER — ERTAPENEM SODIUM 1 G IJ SOLR
INTRAMUSCULAR | Status: AC
  Filled 2013-08-19: qty 1

## 2013-08-19 MED ORDER — GLYCOPYRROLATE 0.2 MG/ML IJ SOLN
INTRAMUSCULAR | Status: DC | PRN
  Administered 2013-08-19: 0.3 mg via INTRAVENOUS

## 2013-08-19 MED ORDER — SUCCINYLCHOLINE CHLORIDE 20 MG/ML IJ SOLN
INTRAMUSCULAR | Status: AC
  Filled 2013-08-19: qty 1

## 2013-08-19 MED ORDER — ACETAMINOPHEN 325 MG PO TABS
650.0000 mg | ORAL_TABLET | ORAL | Status: DC | PRN
  Administered 2013-08-22: 650 mg via ORAL
  Filled 2013-08-19: qty 2

## 2013-08-19 MED ORDER — ALVIMOPAN 12 MG PO CAPS
12.0000 mg | ORAL_CAPSULE | Freq: Once | ORAL | Status: AC
  Administered 2013-08-19: 12 mg via ORAL
  Filled 2013-08-19: qty 1

## 2013-08-19 MED ORDER — ONDANSETRON HCL 4 MG/2ML IJ SOLN
INTRAMUSCULAR | Status: AC
  Filled 2013-08-19: qty 2

## 2013-08-19 MED ORDER — HYDROMORPHONE HCL PF 1 MG/ML IJ SOLN
0.2500 mg | INTRAMUSCULAR | Status: DC | PRN
  Administered 2013-08-19 (×2): 0.5 mg via INTRAVENOUS

## 2013-08-19 MED ORDER — ACETAMINOPHEN 10 MG/ML IV SOLN
INTRAVENOUS | Status: DC | PRN
  Administered 2013-08-19: 1000 mg via INTRAVENOUS

## 2013-08-19 SURGICAL SUPPLY — 53 items
APPLICATOR COTTON TIP 6IN STRL (MISCELLANEOUS) IMPLANT
BLADE EXTENDED COATED 6.5IN (ELECTRODE) ×3 IMPLANT
BLADE HEX COATED 2.75 (ELECTRODE) ×6 IMPLANT
BLADE SURG SZ10 CARB STEEL (BLADE) ×2 IMPLANT
CHLORAPREP W/TINT 26ML (MISCELLANEOUS) ×3 IMPLANT
COUNTER NEEDLE 20 DBL MAG RED (NEEDLE) ×3 IMPLANT
COVER MAYO STAND STRL (DRAPES) ×6 IMPLANT
DRAPE LAPAROSCOPIC ABDOMINAL (DRAPES) ×3 IMPLANT
DRAPE LG THREE QUARTER DISP (DRAPES) ×1 IMPLANT
DRAPE UTILITY XL STRL (DRAPES) ×6 IMPLANT
DRAPE WARM FLUID 44X44 (DRAPE) ×3 IMPLANT
DRSG OPSITE POSTOP 4X10 (GAUZE/BANDAGES/DRESSINGS) ×2 IMPLANT
DRSG OPSITE POSTOP 4X6 (GAUZE/BANDAGES/DRESSINGS) IMPLANT
DRSG OPSITE POSTOP 4X8 (GAUZE/BANDAGES/DRESSINGS) IMPLANT
ELECT REM PT RETURN 9FT ADLT (ELECTROSURGICAL) ×3
ELECTRODE REM PT RTRN 9FT ADLT (ELECTROSURGICAL) ×1 IMPLANT
GLOVE BIOGEL PI IND STRL 7.0 (GLOVE) IMPLANT
GLOVE BIOGEL PI INDICATOR 7.0 (GLOVE) ×8
GLOVE SURG ORTHO 8.0 STRL STRW (GLOVE) ×6 IMPLANT
GLOVE SURG SS PI 6.5 STRL IVOR (GLOVE) ×6 IMPLANT
GOWN STRL REUS W/TWL XL LVL3 (GOWN DISPOSABLE) ×12 IMPLANT
KIT BASIN OR (CUSTOM PROCEDURE TRAY) ×5 IMPLANT
LEGGING LITHOTOMY PAIR STRL (DRAPES) ×1 IMPLANT
LIGASURE IMPACT 36 18CM CVD LR (INSTRUMENTS) ×3 IMPLANT
LUBRICANT JELLY K Y 4OZ (MISCELLANEOUS) IMPLANT
PACK GENERAL/GYN (CUSTOM PROCEDURE TRAY) ×3 IMPLANT
PAD ABD 8X10 STRL (GAUZE/BANDAGES/DRESSINGS) IMPLANT
PENCIL BUTTON HOLSTER BLD 10FT (ELECTRODE) ×3 IMPLANT
RELOAD PROXIMATE 75MM BLUE (ENDOMECHANICALS) ×6 IMPLANT
RELOAD STAPLE 75 3.8 BLU REG (ENDOMECHANICALS) IMPLANT
SPONGE GAUZE 4X4 12PLY (GAUZE/BANDAGES/DRESSINGS) IMPLANT
STAPLER GUN LINEAR PROX 60 (STAPLE) ×2 IMPLANT
STAPLER PROXIMATE 75MM BLUE (STAPLE) ×2 IMPLANT
STAPLER VISISTAT 35W (STAPLE) ×3 IMPLANT
SUCTION POOLE TIP (SUCTIONS) ×3 IMPLANT
SUT NOV 1 T60/GS (SUTURE) IMPLANT
SUT NOVA NAB DX-16 0-1 5-0 T12 (SUTURE) ×4 IMPLANT
SUT NOVA T20/GS 25 (SUTURE) ×3 IMPLANT
SUT PDS AB 1 TP1 96 (SUTURE) IMPLANT
SUT SILK 2 0 (SUTURE) ×6
SUT SILK 2 0 SH CR/8 (SUTURE) ×3 IMPLANT
SUT SILK 2 0SH CR/8 30 (SUTURE) IMPLANT
SUT SILK 2-0 18XBRD TIE 12 (SUTURE) ×1 IMPLANT
SUT SILK 2-0 30XBRD TIE 12 (SUTURE) ×1 IMPLANT
SUT SILK 3 0 (SUTURE) ×3
SUT SILK 3 0 SH CR/8 (SUTURE) ×3 IMPLANT
SUT SILK 3-0 18XBRD TIE 12 (SUTURE) ×1 IMPLANT
TOWEL OR 17X26 10 PK STRL BLUE (TOWEL DISPOSABLE) ×6 IMPLANT
TOWEL OR NON WOVEN STRL DISP B (DISPOSABLE) ×6 IMPLANT
TRAY FOLEY CATH 14FRSI W/METER (CATHETERS) ×1 IMPLANT
TUBING CONNECTING 10 (TUBING) IMPLANT
TUBING CONNECTING 10' (TUBING)
YANKAUER SUCT BULB TIP 10FT TU (MISCELLANEOUS) ×3 IMPLANT

## 2013-08-19 NOTE — Interval H&P Note (Signed)
History and Physical Interval Note:  08/19/2013 8:40 AM  James Krueger  has presented today for surgery, with the diagnosis of adenocarcinoma of the colon.  The various methods of treatment have been discussed with the patient and family. After consideration of risks, benefits and other options for treatment, the patient has consented to    Procedure(s): PARTIAL COLECTOMY (N/A) as a surgical intervention .    The patient's history has been reviewed, patient examined, no change in status, stable for surgery.  I have reviewed the patient's chart and labs.  Questions were answered to the patient's satisfaction.    [name], MD, [facility] Office: [place]

## 2013-08-19 NOTE — Anesthesia Postprocedure Evaluation (Signed)
  Anesthesia Post-op Note  Patient: James Krueger  Procedure(s) Performed: Procedure(s) (LRB): PARTIAL COLECTOMY (N/A)  Patient Location: PACU  Anesthesia Type: General  Level of Consciousness: awake and alert   Airway and Oxygen Therapy: Patient Spontanous Breathing  Post-op Pain: mild  Post-op Assessment: Post-op Vital signs reviewed, Patient's Cardiovascular Status Stable, Respiratory Function Stable, Patent Airway and No signs of Nausea or vomiting  Last Vitals:  Filed Vitals:   08/19/13 1130  BP: 153/81  Pulse: 76  Temp:   Resp: 14    Post-op Vital Signs: stable   Complications: No apparent anesthesia complications

## 2013-08-19 NOTE — Brief Op Note (Signed)
08/19/2013  10:40 AM  PATIENT:  Claudette Laws  63 y.o. male  PRE-OPERATIVE DIAGNOSIS:  adenocarcinoma of the colon  POST-OPERATIVE DIAGNOSIS:  adenocarcinoma of the colon  PROCEDURE:  Procedure(s): PARTIAL COLECTOMY (N/A)  SURGEON:  Surgeon(s) and Role:    * [name], MD - Primary  ANESTHESIA:   general  EBL:  Total I/O In: 1000 [I.V.:1000] Out: 130 [Urine:70; Blood:60]  BLOOD ADMINISTERED:none  DRAINS: none   LOCAL MEDICATIONS USED:  NONE  SPECIMEN:  Excision  DISPOSITION OF SPECIMEN:  PATHOLOGY  COUNTS:  YES  TOURNIQUET:  * No tourniquets in log *  DICTATION: .Other Dictation: Dictation Number [id number]  PLAN OF CARE: Admit to inpatient   PATIENT DISPOSITION:  PACU - hemodynamically stable.   Delay start of Pharmacological VTE agent (>24hrs) due to surgical blood loss or risk of bleeding: yes  [name], MD, [facility] Office: [phone]

## 2013-08-19 NOTE — H&P (View-Only) (Signed)
General Surgery - [facility]  Chief Complaint  Patient presents with  . New Evaluation    colon cancer - referral from Dr. Malcolm Stark    HISTORY: Patient is a 63-year-old male referred by his gastroenterologist for newly diagnosed adenocarcinoma of the colon.  Patient has a history of prior adenocarcinoma of the colon for which he underwent right colectomy 19 years ago. He received adjuvant chemotherapy. He has had close followup with regular colonoscopic examinations. His most recent study from 11 days ago shows a 2.5 cm tumor just distal to his prior anastomosis. Biopsies show invasive adenocarcinoma. 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 There was also a tubulovillous adenoma in the sigmoid colon which was endoscopically resected.  Patient has had no symptoms. He denies any bleeding per rectum. He denies any pain.  Past Medical History  Diagnosis Date  . Colon cancer     No current outpatient prescriptions on file.   No current facility-administered medications for this visit.    No Known Allergies  Family History  Problem Relation Age of Onset  . Colon cancer Father   . Colon cancer Brother   . Pancreatic cancer Neg Hx   . Stomach cancer Neg Hx     History   Social History  . Marital Status: Married    Spouse Name: N/A    Number of Children: N/A  . Years of Education: N/A   Social History Main Topics  . Smoking status: Never Smoker   . Smokeless tobacco: Never Used  . Alcohol Use: No  . Drug Use: No  . Sexual Activity: None   Other Topics Concern  . None   Social History Narrative  . None    REVIEW OF SYSTEMS - PERTINENT POSITIVES ONLY: Denies bleeding per rectum. Denies pain. No sign of abdominal wall hernia.  EXAM: Filed Vitals:   08/11/13 1402  BP: 120/80  Pulse: 80  Temp: 98.6 F (37 C)  Resp: 16    GENERAL: well-developed, well-nourished, no acute distress HEENT: normocephalic;  pupils equal and reactive; sclerae clear; dentition good; mucous membranes moist NECK:  symmetric on extension; no palpable anterior or posterior cervical lymphadenopathy; no supraclavicular masses; no tenderness CHEST: clear to auscultation bilaterally without rales, rhonchi, or wheezes CARDIAC: regular rate and rhythm without significant murmur; peripheral pulses are full ABDOMEN: soft without distension; bowel sounds present; no mass; no hepatosplenomegaly; no hernia; well-healed midline surgical incision EXT:  non-tender without edema; no deformity NEURO: no gross focal deficits; no sign of tremor   LABORATORY RESULTS: See [facility] (CHL-Epic) for most recent results  RADIOLOGY RESULTS: See [facility] (CHL-Epic) for most recent results  IMPRESSION: Adenocarcinoma of the colon, metachronous  PLAN: I had a lengthy discussion with the patient and his wife regarding these findings. We reviewed options for treatment. I have recommended open laparotomy with resection of distal small bowel, anastomosis, and proximal transverse colon with primary anastomosis. We discussed risk and benefits of the procedure. We discussed the hospital stay to be anticipated in his postoperative recovery and returned activity. They understand and wish to proceed.  The risks and benefits of the procedure have been discussed at length with the patient.  The patient understands the proposed procedure, potential alternative treatments, and the course of recovery to be expected.  All of the patient's questions have been answered at this time.  The patient wishes to proceed with surgery.  Cynthia Stainback M. Dacen Frayre, MD, FACS General &   Endocrine Surgery [facility]  Primary Care Physician: No PCP Per Patient

## 2013-08-19 NOTE — Transfer of Care (Signed)
Immediate Anesthesia Transfer of Care Note  Patient: James Krueger  Procedure(s) Performed: Procedure(s): PARTIAL COLECTOMY (N/A)  Patient Location: PACU  Anesthesia Type:General  Level of Consciousness: sedated  Airway & Oxygen Therapy: Patient Spontanous Breathing and Patient connected to face mask oxygen  Post-op Assessment: Report given to PACU RN and Post -op Vital signs reviewed and stable  Post vital signs: stable  Complications: No apparent anesthesia complications

## 2013-08-20 ENCOUNTER — Encounter (HOSPITAL_COMMUNITY): Payer: Self-pay | Admitting: Surgery

## 2013-08-20 MED ORDER — ALVIMOPAN 12 MG PO CAPS
12.0000 mg | ORAL_CAPSULE | Freq: Two times a day (BID) | ORAL | Status: DC
  Administered 2013-08-20 – 2013-08-22 (×4): 12 mg via ORAL
  Filled 2013-08-20 (×6): qty 1

## 2013-08-20 MED ORDER — HYDROCODONE-ACETAMINOPHEN 5-325 MG PO TABS
1.0000 | ORAL_TABLET | ORAL | Status: DC | PRN
  Administered 2013-08-20: 1 via ORAL
  Filled 2013-08-20: qty 2

## 2013-08-20 MED ORDER — HYDROMORPHONE HCL PF 1 MG/ML IJ SOLN
0.5000 mg | INTRAMUSCULAR | Status: DC | PRN
  Administered 2013-08-21: 0.5 mg via INTRAVENOUS
  Administered 2013-08-21 – 2013-08-22 (×2): 1 mg via INTRAVENOUS
  Administered 2013-08-22: 0.5 mg via INTRAVENOUS
  Filled 2013-08-20 (×5): qty 1

## 2013-08-20 NOTE — Care Management Note (Signed)
    Page 1 of 1   08/20/2013     11:53:52 AM   CARE MANAGEMENT NOTE 08/20/2013  Patient:  James Krueger,James Krueger   Account Number:  [id number]  Date Initiated:  08/20/2013  Documentation initiated by:  Sunday Spillers  Subjective/Objective Assessment:   63 yo male admitted s/p Resection of ileocolonic anastomosis and proximal transverse colon. PTA lived at home with spouse.     Action/Plan:   Home when stable   Anticipated DC Date:  08/24/2013   Anticipated DC Plan:  [place]  CM consult      Choice offered to / List presented to:             Status of service:  Completed, signed off Medicare Important Message given?   (If response is "NO", the following Medicare IM given date fields will be blank) Date Medicare IM given:   Date Additional Medicare IM given:    Discharge Disposition:  HOME/SELF CARE  Per UR Regulation:  Reviewed for med. necessity/level of care/duration of stay  If discussed at [place] of Stay Meetings, dates discussed:    Comments:

## 2013-08-20 NOTE — Progress Notes (Signed)
Patient ID: James Krueger, male   DOB: 1950/08/09, 63 y.o.   MRN: [id number]  General Surgery - [facility] - Progress Note  POD# 1  Subjective: Patient up in bed.  Wife at bedside.  Ambulated in halls this AM.  Using IS.  Started on clear liquids at his request - no nausea or emesis.  Pain controlled with PCA.  Objective: Vital signs in last 24 hours: Temp:  [97.5 F (36.4 C)-99.7 F (37.6 C)] 99.7 F (37.6 C) (03/05 1014) Pulse Rate:  [55-86] 71 (03/05 1014) Resp:  [8-22] 17 (03/05 1127) BP: (140-171)/(66-89) 171/79 mmHg (03/05 1014) SpO2:  [97 %-100 %] 98 % (03/05 1127) Weight:  [173 lb 8 oz (78.7 kg)] 173 lb 8 oz (78.7 kg) (03/04 1430) Last BM Date: 08/19/13  Intake/Output from previous day: 03/04 0701 - 03/05 0700 In: 3641.7 [I.V.:3641.7] Out: 1880 [Urine:1820; Blood:60]  Exam: HEENT - clear, not icteric Neck - soft Chest - clear bilaterally Cor - RRR, no murmur Abd - soft without distension; wound clear and dry Ext - no significant edema Neuro - grossly intact, no focal deficits  Lab Results:   Recent Labs  08/19/13 1500  WBC 17.6*  HGB 12.4*  HCT 36.0*  PLT 263     Recent Labs  08/19/13 1500  CREATININE 0.84    Studies/Results: No results found.  Assessment / Plan: 1.  Status post ileocolonic resection for adenocarcinoma  Await path report  OOB, ambulate, IS use  Await resolution of ileus - on Entereg  Clear liquid diet  [name], MD, [facility] Office: [phone]  08/20/2013

## 2013-08-20 NOTE — Progress Notes (Signed)
Wasted Dilaudid PCA 30ml in sink with Alphonzo Lemmings, RN

## 2013-08-20 NOTE — Op Note (Signed)
NAMEMarland Kitchen  James Krueger, James Krueger NO.:  [id number]  MEDICAL RECORD NO.:  [id number]  LOCATION:  70                         FACILITY:  [facility]  PHYSICIAN:  [name], MD      DATE OF BIRTH:  [birth year]  DATE OF PROCEDURE:  08/19/2013                               OPERATIVE REPORT   PREOPERATIVE DIAGNOSIS:  Adenocarcinoma of the colon.  POSTOPERATIVE DIAGNOSIS:  Adenocarcinoma of the colon.  PROCEDURE:  Resection of ileocolonic anastomosis and proximal transverse colon.  SURGEON:  [name], MD, FACS  ANESTHESIA:  General per Dr. Rod Mae  ESTIMATED BLOOD LOSS:  Minimal.  PREPARATION:  ChloraPrep.  COMPLICATIONS:  None.  INDICATIONS:  The patient is a 63 year old male with a history of adenocarcinoma of the colon, for which he underwent right colectomy 19 years ago.  The patient has had regular surveillance.  He was found on recent colonoscopy to have a 2.5 cm adenocarcinoma just distal to his previous ileocolonic anastomosis.  This was biopsy-proven adenocarcinoma.  The site was marked with ink by Dr. Lucio Edward at the time of colonoscopy.  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  The patient is referred at this time for resection.  BODY OF REPORT:  Procedure was done in OR #6 at the [facility].  The patient was brought to the operating room, placed in a supine position on the operating room table.  Following administration of general anesthesia, the patient was positioned and then prepped and draped in the usual aseptic fashion.  After ascertaining that an adequate level of anesthesia had been achieved, the patient's previous midline incision was opened with a #10 blade. Dissection was carried through subcutaneous tissues.  Suture material was extracted.  Fascia was incised in the midline and the peritoneal cavity was entered cautiously.  Adhesions of the omentum to the anterior abdominal wall were taken down  using the electrocautery for hemostasis. A limited lysis of adhesions was performed on the distal small bowel. The ileocolonic anastomosis was identified in the right upper quadrant of the abdomen.  Using sharp dissection, adhesions were lysed allowing for mobilization.  The sweep of the duodenum was identified and preserved.  The adhesions to the gallbladder were taken down sharply. Adhesions to the liver were taken down with the electrocautery.  The omentum was mobilized off the area with the electrocautery.  The tattoo was visible.  The tumor was palpable in the proximal transverse colon opposite the anastomosis.  A 0.12 cm distal to the tumor was selected and transected with a GIA stapler.  A point in the distal ileum approximately 10 cm prior to the anastomosis was selected and transected with a GIA stapler.  Mesentery of the distal small bowel and the proximal transverse colon was then divided using the ligature.  Large vessels were clamped, divided with the ligature, and then ligated with 2- 0 silk suture ligatures.  The ileocolonic anastomosis and proximal transverse colon were thus resected and submitted to Pathology for review.  Next, a side-to-side functionally end-to-end anastomosis was created between the ileum and the mid transverse colon.  Staple line was inspected for hemostasis.  Enterotomy  was closed with a TA 60 stapler. Mesenteric defect was closed with interrupted 3-0 silk sutures.  Good hemostasis was noted.  Abdomen was irrigated with warm saline, which was evacuated.  Omentum was used to cover the anastomosis.  Exploration of the abdomen revealed no evidence of metastatic disease in the liver, omentum, pelvis, or peritoneal surfaces.  Midline abdominal incision was then closed with interrupted #1 Novafil simple sutures.  Subcutaneous tissues were irrigated.  Skin was closed with stainless steel staples.  Sterile dressings were applied.  The patient was awakened  from anesthesia and brought to the recovery room. The patient tolerated the procedure well.   [name], MD, [facility] Office: [phone]    TMG/MEDQ  D:  08/19/2013  T:  08/20/2013  Job:  [id number]  cc:   Venia Carbon, MD Fax: [phone]  Pricilla Riffle. Fuller Plan, MD, FACG [address] [place] [address]  Ladell Pier, M.D. Fax: [phone]

## 2013-08-21 MED ORDER — PANTOPRAZOLE SODIUM 40 MG IV SOLR
40.0000 mg | INTRAVENOUS | Status: DC
  Administered 2013-08-21 – 2013-08-22 (×2): 40 mg via INTRAVENOUS
  Filled 2013-08-21 (×4): qty 40

## 2013-08-21 MED ORDER — ALUM & MAG HYDROXIDE-SIMETH 200-200-20 MG/5ML PO SUSP
30.0000 mL | Freq: Four times a day (QID) | ORAL | Status: DC | PRN
  Administered 2013-08-21: 30 mL via ORAL
  Filled 2013-08-21: qty 30

## 2013-08-21 NOTE — Progress Notes (Signed)
Patient ID: James Krueger, male   DOB: Oct 13, 1950, 63 y.o.   MRN: [id number]  General Surgery - [facility] - Progress Note  POD# 2  Subjective: Patient with reflux symptoms, one episode emesis yesterday.  Nausea with Vicodin.  Good pain control with morphine IV.  Objective: Vital signs in last 24 hours: Temp:  [98.2 F (36.8 C)-99.7 F (37.6 C)] 98.2 F (36.8 C) (03/06 0610) Pulse Rate:  [66-72] 66 (03/06 0610) Resp:  [17-20] 18 (03/06 0610) BP: (143-175)/(79-88) 143/81 mmHg (03/06 0610) SpO2:  [94 %-98 %] 97 % (03/06 0610) Last BM Date: 08/19/13  Intake/Output from previous day: 03/05 0701 - 03/06 0700 In: 2775 [P.O.:360; I.V.:2415] Out: 2890 [Urine:2890]  Exam: HEENT - clear, not icteric Neck - soft Chest - clear bilaterally Cor - RRR, no murmur Abd - soft, mild distension; few BS; incision clear and dry and intact Ext - no significant edema Neuro - grossly intact, no focal deficits  Lab Results:   Recent Labs  08/19/13 1500  WBC 17.6*  HGB 12.4*  HCT 36.0*  PLT 263     Recent Labs  08/19/13 1500  CREATININE 0.84    Studies/Results: No results found.  Assessment / Plan: 1.  Status post partial colectomy for adenocarcinoma  Path back and reviewed with patient  Will forward path to Dr. Benay Spice  Continue clear liquid diet today  Add IV protonix  Ambulate in halls  [name], MD, [facility] Office: [phone]  08/21/2013

## 2013-08-21 NOTE — Progress Notes (Signed)
Patient complains of heart burn states "it is killing me and it is terrible", Dr Barry Dienes paged, await response.  Orders obtained

## 2013-08-22 NOTE — Progress Notes (Signed)
Pt had one loose stool this am and 2 this afternoon.

## 2013-08-22 NOTE — Progress Notes (Signed)
3 Days Post-Op  Subjective: No complaints. Had an episode of nausea overnight but no vomiting. No bm or flatus yet  Objective: Vital signs in last 24 hours: Temp:  [98.5 F (36.9 C)-99 F (37.2 C)] 99 F (37.2 C) (03/07 0528) Pulse Rate:  [58-68] 58 (03/07 0528) Resp:  [18] 18 (03/07 0528) BP: (141-159)/(77-87) 159/77 mmHg (03/07 0528) SpO2:  [99 %] 99 % (03/07 0528) Last BM Date:  (PTA)  Intake/Output from previous day: 03/06 0701 - 03/07 0700 In: 1800 [P.O.:600; I.V.:1200] Out: 4000 [Urine:4000] Intake/Output this shift:    Resp: clear to auscultation bilaterally Cardio: regular rate and rhythm GI: soft, minimal tenderness. incision looks good. good bs  Lab Results:   Recent Labs  08/19/13 1500  WBC 17.6*  HGB 12.4*  HCT 36.0*  PLT 263   BMET  Recent Labs  08/19/13 1500  CREATININE 0.84   PT/INR No results found for this basename: LABPROT, INR,  in the last 72 hours ABG No results found for this basename: PHART, PCO2, PO2, HCO3,  in the last 72 hours  Studies/Results: No results found.  Anti-infectives: Anti-infectives   Start     Dose/Rate Route Frequency Ordered Stop   08/19/13 0645  ertapenem (INVANZ) 1 g in sodium chloride 0.9 % 50 mL IVPB     1 g 100 mL/hr over 30 Minutes Intravenous On call to O.R. 08/19/13 0626 08/19/13 0830      Assessment/Plan: s/p Procedure(s): PARTIAL COLECTOMY (N/A) Advance diet. Start full liquids and monitor ambulate  LOS: 3 days    TOTH III,PAUL S 08/22/2013

## 2013-08-23 MED ORDER — OXYCODONE-ACETAMINOPHEN 5-325 MG PO TABS: 1.0000 | ORAL_TABLET | ORAL | Status: DC | PRN

## 2013-08-23 MED ORDER — OXYCODONE-ACETAMINOPHEN 5-325 MG PO TABS
1.0000 | ORAL_TABLET | ORAL | Status: DC | PRN
  Administered 2013-08-23: 1 via ORAL
  Filled 2013-08-23: qty 1

## 2013-08-23 MED ORDER — PANTOPRAZOLE SODIUM 40 MG PO TBEC
40.0000 mg | DELAYED_RELEASE_TABLET | Freq: Every day | ORAL | Status: DC
  Administered 2013-08-23: 40 mg via ORAL
  Filled 2013-08-23: qty 1

## 2013-08-23 NOTE — Progress Notes (Signed)
4 Days Post-Op  Subjective: Feels good. Having bm's. Ready to go home  Objective: Vital signs in last 24 hours: Temp:  [98.4 F (36.9 C)-98.6 F (37 C)] 98.4 F (36.9 C) (03/08 0525) Pulse Rate:  [62-77] 62 (03/08 0525) Resp:  [18] 18 (03/08 0525) BP: (136-155)/(73-86) 155/77 mmHg (03/08 0525) SpO2:  [97 %-99 %] 99 % (03/08 0525) Last BM Date:  (PTA)  Intake/Output from previous day: 03/07 0701 - 03/08 0700 In: 1200 [I.V.:1200] Out: 1400 [Urine:1400] Intake/Output this shift: Total I/O In: 240 [P.O.:240] Out: -   Resp: clear to auscultation bilaterally Cardio: regular rate and rhythm GI: soft, nontender. good bs. incision looks good  Lab Results:  No results found for this basename: WBC, HGB, HCT, PLT,  in the last 72 hours BMET No results found for this basename: NA, K, CL, CO2, GLUCOSE, BUN, CREATININE, CALCIUM,  in the last 72 hours PT/INR No results found for this basename: LABPROT, INR,  in the last 72 hours ABG No results found for this basename: PHART, PCO2, PO2, HCO3,  in the last 72 hours  Studies/Results: No results found.  Anti-infectives: Anti-infectives   Start     Dose/Rate Route Frequency Ordered Stop   08/19/13 0645  ertapenem (INVANZ) 1 g in sodium chloride 0.9 % 50 mL IVPB     1 g 100 mL/hr over 30 Minutes Intravenous On call to O.R. 08/19/13 0626 08/19/13 0830      Assessment/Plan: s/p Procedure(s): PARTIAL COLECTOMY (N/A) Advance diet Discharge  LOS: 4 days    TOTH III,PAUL S 08/23/2013

## 2013-08-23 NOTE — Plan of Care (Signed)
Problem: Discharge Progression Outcomes Goal: Staples/sutures removed Outcome: Not Met (add Reason) To be done in MD office

## 2013-08-24 ENCOUNTER — Telehealth (INDEPENDENT_AMBULATORY_CARE_PROVIDER_SITE_OTHER): Payer: Self-pay

## 2013-08-24 NOTE — Telephone Encounter (Signed)
Pt home doing well. PO appt made. 

## 2013-08-25 NOTE — Discharge Summary (Signed)
  Physician Discharge Summary [facility]  Patient ID: James Krueger MRN: [id number] DOB/AGE: 62-08-1950 62 y.o.  Admit date: 08/19/2013 Discharge date: 08/23/2013  Admission Diagnoses:  Adenocarcinoma of the colon  Discharge Diagnoses:  Principal Problem:   Adenocarcinoma of colon Active Problems:   Colon cancer   Discharged Condition: good  Hospital Course: patient admitted after partial colectomy for carcinoma.  Post op course uncomplicated.  Began clear liquids and advanced to diet with return of bowel function.  Prepared for discharge home on POD#4.  Consults: None  Significant Diagnostic Studies: none  Treatments: surgery: partial colectomy with primary anastomosis  Discharge Exam: Blood pressure 155/77, pulse 62, temperature 98.4 F (36.9 C), temperature source Oral, resp. rate 18, height 5\' 7"  (1.702 m), weight 173 lb 8 oz (78.7 kg), SpO2 99.00%. See exam on progress note by Dr. Autumn Messing on day of discharge.  Disposition: Home with family  Discharge Orders   Future Appointments Provider Department Dept Phone   08/28/2013 2:45 PM [name], MD [facility], [place]   09/11/2013 1:30 PM [facility] [phone]   09/11/2013 2:00 PM Ladell Pier, MD [facility] [phone]   Future Orders Complete By Expires   Call MD for:  difficulty breathing, headache or visual disturbances  As directed    Call MD for:  extreme fatigue  As directed    Call MD for:  hives  As directed    Call MD for:  persistant dizziness or light-headedness  As directed    Call MD for:  persistant nausea and vomiting  As directed    Call MD for:  redness, tenderness, or signs of infection (pain, swelling, redness, odor or green/yellow discharge around incision site)  As directed    Call MD for:  severe uncontrolled pain  As directed    Call MD for:  temperature  >100.4  As directed    Diet - low sodium heart healthy  As directed    Discharge instructions  As directed    Comments:     May shower. Diet as tolerated. No heavy lifting. Colace and miralax if needed to avoid constipation   Increase activity slowly  As directed    No wound care  As directed        Medication List         oxyCODONE-acetaminophen 5-325 MG per tablet  Commonly known as:  ROXICET  Take 1-2 tablets by mouth every 4 (four) hours as needed for severe pain.           Follow-up Information   Follow up with [name], MD In 1 week. (For suture removal)    Specialty:  General Surgery   Contact information:   [address] [place] [address] [phone]       [name], MD, [facility] Office: [phone]   Signed: [name] 08/25/2013, 2:14 PM

## 2013-08-27 ENCOUNTER — Encounter (HOSPITAL_COMMUNITY): Payer: Self-pay

## 2013-08-27 ENCOUNTER — Telehealth: Payer: Self-pay | Admitting: *Deleted

## 2013-08-27 NOTE — Telephone Encounter (Signed)
Spoke with patient by phone and confirmed appointment with Dr. Benay Spice for 09/11/13.  Contact names, numbers, and directions were provided.

## 2013-08-28 ENCOUNTER — Encounter (INDEPENDENT_AMBULATORY_CARE_PROVIDER_SITE_OTHER): Payer: Self-pay | Admitting: Surgery

## 2013-08-28 ENCOUNTER — Ambulatory Visit (INDEPENDENT_AMBULATORY_CARE_PROVIDER_SITE_OTHER): Payer: 59 | Admitting: Surgery

## 2013-08-28 VITALS — BP 148/80 | HR 80 | Temp 97.7°F | Resp 16 | Ht 67.0 in | Wt 158.0 lb

## 2013-08-28 DIAGNOSIS — C189 Malignant neoplasm of colon, unspecified: Secondary | ICD-10-CM

## 2013-08-28 NOTE — Patient Instructions (Signed)
  COCOA BUTTER & VITAMIN E CREAM  ([employer] or other brand)  Apply cocoa butter/vitamin E cream to your incision 2 - 3 times daily.  Massage cream into incision for one minute with each application.  Use sunscreen (50 SPF or higher) for first 6 months after surgery if area is exposed to sun.  You may substitute Mederma or other scar reducing creams as desired.   

## 2013-08-28 NOTE — Progress Notes (Signed)
General Surgery [facility]  Chief Complaint  Patient presents with  . Routine Post Op    partial colectomy for adenocarcinoma    HISTORY: Patient is a 63 year old male who underwent partial colectomy for adenocarcinoma of the colon. Surgical margins were negative. Lymph nodes were negative for metastatic disease. Additional genetic testing is pending.  Patient has done well since discharge. He is tolerating a regular diet. He is having 3-4 soft bowel movements daily with good control. He denies any significant pain.  EXAM: Surgical wound is healing nicely. Staples were removed and benzoin and Steri-Strips are applied.  IMPRESSION: Adenocarcinoma of the colon, status post partial colectomy  PLAN: Patient will remove Steri-Strips and 4-5 days. He will then begin applying topical creams to his incision.  Patient is scheduled to see Dr. Julieanne Manson in medical oncology in 2 weeks.  Patient will return for wound check in 6 weeks.  [name], MD, [facility]   Visit Diagnoses: 1. Adenocarcinoma of colon

## 2013-08-31 ENCOUNTER — Telehealth: Payer: Self-pay | Admitting: *Deleted

## 2013-08-31 NOTE — Telephone Encounter (Signed)
Spoke with patient by phone and confirmed appointment with Dr. Benay Spice for 3/31//15. Contact names, numbers, and directions were provided

## 2013-08-31 NOTE — Telephone Encounter (Signed)
Received phone call from patient questioning why he was being referred to a Medical Oncologist.  This RN explained it is often for completion of evaluation for thorough cancer care and any further recommendations.  Patient was  encouraged to contact referring physician for further questions if needed.  He verbalized comprehension.

## 2013-09-02 ENCOUNTER — Encounter (INDEPENDENT_AMBULATORY_CARE_PROVIDER_SITE_OTHER): Payer: Self-pay

## 2013-09-11 ENCOUNTER — Ambulatory Visit: Payer: 59 | Admitting: Oncology

## 2013-09-11 ENCOUNTER — Ambulatory Visit: Payer: 59

## 2013-09-15 ENCOUNTER — Encounter: Payer: Self-pay | Admitting: Oncology

## 2013-09-15 ENCOUNTER — Ambulatory Visit (HOSPITAL_BASED_OUTPATIENT_CLINIC_OR_DEPARTMENT_OTHER): Payer: 59 | Admitting: Oncology

## 2013-09-15 ENCOUNTER — Ambulatory Visit: Payer: 59

## 2013-09-15 VITALS — BP 141/74 | HR 67 | Temp 97.7°F | Resp 18 | Ht 67.0 in | Wt 161.9 lb

## 2013-09-15 DIAGNOSIS — Z85038 Personal history of other malignant neoplasm of large intestine: Secondary | ICD-10-CM

## 2013-09-15 DIAGNOSIS — Z800 Family history of malignant neoplasm of digestive organs: Secondary | ICD-10-CM

## 2013-09-15 DIAGNOSIS — C189 Malignant neoplasm of colon, unspecified: Secondary | ICD-10-CM

## 2013-09-15 DIAGNOSIS — Z86010 Personal history of colonic polyps: Secondary | ICD-10-CM

## 2013-09-15 DIAGNOSIS — C184 Malignant neoplasm of transverse colon: Secondary | ICD-10-CM

## 2013-09-15 NOTE — Progress Notes (Signed)
[facility] New Patient Consult   Referring MD: Lark Runk 63 y.o.  [birth year]    Reason for Referral: Colon cancer   HPI: Mr. James Krueger has a history of colon cancer dating to 96. He underwent a right colectomy and received adjuvant chemotherapy (we do not have the 1996 records available today). He has been followed by Dr. start for surveillance colonoscopies.  He underwent a colonoscopy on 07/21/2013. A firm mass was found in the a sending colon adjacent to the anastomosis. Multiple biopsies were performed. A tattoo was applied. A 7 mm polyp was found in the ascending colon at the edge of the anastomosis. It was not removed. Biopsies were obtained. A 2.5 cm sessile polyp was found in the sigmoid colon. A polyp was removed. A tattoo was applied. The pathology revealed invasive adenocarcinoma and fragments of a tubulovillous adenoma at the a sending colon biopsies. The sigmoid colon polyp returned as a tubulovillous adenoma with no high-grade dysplasia or malignancy.  CTs of the chest abdomen and pelvis on 08/06/2013 revealed no suspicious pulmonary nodules. The liver and adrenal glands appeared normal. Changes of a right hemicolectomy. A mass was not noted in the remaining colon. No suspicious abdominopelvic lymphadenopathy.  He was referred to Dr. Harlow Krueger and was taken the operating room on 08/19/2013 resection of the ileocolonic anastomosis and proximal transverse colon. Tumor was palpable in the proximal transverse colon. No evidence of metastatic disease in the liver, omentum, pelvis, or peritoneal surfaces.  The pathology ([id number]) confirmed a poorly differentiated invasive adenocarcinoma. Tumor invaded through the muscular propria into pericolonic fatty tissue. 8 lymph nodes were negative for metastatic carcinoma. The resection margins were negative. Lymphovascular and perineural invasion were not identified. There was one additional tubular adenoma with  no evidence of high-grade dysplasia or malignancy. A prominent Crohn's like reaction was noted at periphery of the tumor with extracellular mucin. The tumor returned microsatellite instability-high with loss of expression of PMS2.  James Krueger reports an uneventful operative recovery.  Past Medical History  Diagnosis Date  . Colon cancer-stage III   1996    Past Surgical History  Procedure Laterality Date  . Colectomy      right 1996  . Partial colectomy N/A 08/19/2013    Procedure: PARTIAL COLECTOMY;  Surgeon: James Regal, MD;  Location: [facility] ORS;  Service: General;  Laterality: N/A;    Medications: Reviewed  Allergies: No Known Allergies  Family history: His brother died of colon cancer at age 70. His father died of "throat" cancer. A maternal cousin died of breast cancer in her 38s. 2 brothers have no history of polyps. No other family history of cancer. Social History:   He lives in [place]. He works in an office occupation. He does not use tobacco or alcohol. No transfusion history. No risk factor for HIV or hepatitis.   ROS:   Positives include: Cutaneous nodular lesion at the right abdomen present for years  A complete ROS was otherwise negative.  Physical Exam:  Blood pressure 141/74, pulse 67, temperature 97.7 F (36.5 C), temperature source Oral, resp. rate 18, height '5\' 7"'  (1.702 m), weight 161 lb 14.4 oz (73.437 kg), SpO2 98.00%.  HEENT: Oropharynx without visible mass, neck without mass Lungs: Clear bilaterally Cardiac: Regular rate and rhythm Abdomen: No hepatosplenomegaly, healed midline incision, nontender, no mass GU: Testes without mass  Vascular: No leg edema Lymph nodes: No cervical, supraclavicular, axillary, or inguinal nodes Neurologic: Alert and oriented, the  motor exam appears intact in the upper and lower extremities Skin: No rash, less than 1 cm nodular cutaneous lesion at the right mid abdomen Musculoskeletal: No spine  tenderness   LAB:  No preoperative CEA   Imaging:  As per history of present illness   Assessment/Plan:   1. 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 Stage III colon cancer 1996, status post a right colectomy followed by adjuvant chemotherapy 3.   family history of colon cancer 4.   Personal history of multiple colon polyps   Disposition:   James Krueger has been diagnosed with a second colon cancer. I discussed the prognosis and adjuvant treatment options with James Krueger and his wife. He has been diagnosed with a stage II tumor. The only known "high-risk "features associated with the current tumor are the limited number of sampled lymph nodes and the grade 3 histology. I discussed the controversy surrounding the use of adjuvant chemotherapy in patients with resected stage II colon cancer. He does not wish to receive adjuvant chemotherapy. 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  I explained the stage for stage prognosis is better in patients with HNPCC compared to patients with sporadic colon cancer. There is also data suggesting a lack of benefit with adjuvant 5-fluorouracil chemotherapy in these patients with stage II cancer.  I recommend a referral to the genetics counselor and mutation testing of the PMS2 gene. James Krueger declined the genetics referral and mutation testing. He also declined a CEA and followup at the [facility]. He understands that his children are at risk of having hereditary non-polyposis colon cancer syndrome. He stated that he will recommend colonoscopy screening for his children, but will not consider genetic testing. I reviewed other cancers that are associated with HNPCC. He does not wish to undergo testing aside from  surveillance colonoscopies.  He plans to continue surveillance colonoscopies with Dr. Fuller Plan. James Krueger is not scheduled for followup at the [facility]. We will be glad to see him in the future as needed.  [place], [place] 09/15/2013, 5:44 PM

## 2013-09-15 NOTE — Progress Notes (Signed)
Checked in new pt with no financial concerns. °

## 2013-09-15 NOTE — Progress Notes (Signed)
Met with patient and wife during MD visit and introduced navigator role.  Patient was not interested in receiving education on colorectal cancer and declined need for support services information.  Patient declined  further genetic testing on tumor and refused setting up appointment to meet with genetics counselor although strongly recommended per MD.  Patient aslo refused CEA today.  MD advised patient to have another colonoscopy in 1 year.  MD advised that patient's children start having colonoscopies in their 33's. Patient verbalized comprehension.

## 2013-10-16 LAB — LIPID PANEL: CHOLESTEROL: 176 mg/dL (ref 0–200)

## 2013-10-20 ENCOUNTER — Encounter (INDEPENDENT_AMBULATORY_CARE_PROVIDER_SITE_OTHER): Payer: Self-pay | Admitting: Surgery

## 2013-10-20 ENCOUNTER — Ambulatory Visit (INDEPENDENT_AMBULATORY_CARE_PROVIDER_SITE_OTHER): Payer: 59 | Admitting: Surgery

## 2013-10-20 VITALS — BP 124/80 | HR 77 | Temp 98.3°F | Ht 67.0 in | Wt 160.0 lb

## 2013-10-20 DIAGNOSIS — C189 Malignant neoplasm of colon, unspecified: Secondary | ICD-10-CM

## 2013-10-20 NOTE — Progress Notes (Signed)
General Surgery [facility]  Chief Complaint  Patient presents with  . Routine Post Op    partial colectomy 08/19/2013    HISTORY: Patient is a 63 year old male who underwent partial colectomy in March 2015 for a metachronous colon carcinoma. Patient has been seen by medical oncology. He does not desire further follow-up or genetic testing.  Patient is doing well. He is returned to his activities. He is playing golf. He is at his preoperative weight. He has no complaints.  EXAM: Abdomen is soft without distention. Midline abdominal incision is well healed. Without some other is no sign of herniation. There are no palpable masses. There is no tenderness.  IMPRESSION: Status post partial colectomy for metachronous colon carcinoma  PLAN: Patient will continue follow-up with his primary physician and his gastroenterologist. He is planning on colonoscopy early in 2016. I would like to see him back following that study to review the results and for physical examination. We will make arrangements for that office visit.  Patient will contact me in the interim if he has any issues or concerns.  [name], MD, [facility]   Visit Diagnoses: 1. Adenocarcinoma of colon

## 2014-03-24 ENCOUNTER — Encounter: Payer: Self-pay | Admitting: Internal Medicine

## 2014-03-24 ENCOUNTER — Ambulatory Visit (INDEPENDENT_AMBULATORY_CARE_PROVIDER_SITE_OTHER): Payer: 59 | Admitting: Internal Medicine

## 2014-03-24 ENCOUNTER — Encounter (INDEPENDENT_AMBULATORY_CARE_PROVIDER_SITE_OTHER): Payer: Self-pay

## 2014-03-24 VITALS — BP 140/82 | HR 72 | Temp 97.8°F | Resp 14 | Ht 66.5 in | Wt 160.2 lb

## 2014-03-24 DIAGNOSIS — M79601 Pain in right arm: Secondary | ICD-10-CM | POA: Insufficient documentation

## 2014-03-24 DIAGNOSIS — Z23 Encounter for immunization: Secondary | ICD-10-CM

## 2014-03-24 DIAGNOSIS — Z0000 Encounter for general adult medical examination without abnormal findings: Secondary | ICD-10-CM

## 2014-03-24 NOTE — Assessment & Plan Note (Addendum)
Clearly exertional and relieved by rest Despite the right arm, it does have the sound of ischemia EKG shows sinus bradycardia but no signs of ischemia Will set up stress test Will check blood work

## 2014-03-24 NOTE — Assessment & Plan Note (Signed)
Discussed taking vitamin due to vegan diet Will check PSA after discussion Tdap today He prefers no flu shot

## 2014-03-24 NOTE — Progress Notes (Signed)
Subjective:    Patient ID: James Krueger, male    DOB: June 23, 1950, 63 y.o.   MRN: [id number]  HPI Here to "reestablish" Just needs physical  Reviewed recent colon cancer recurrence Had surgery and no adjunctive therapy  Vegan diet Tries to exercise regularly Cholesterol down to 176 recently  Uses treadmill regularly Has noticed pain in right arm if he goes over 10 minutes on the treadmill No chest pain No SOB He will slow down or stop. Resolves with stopping Doesn't seem to be muscular thing  No current outpatient prescriptions on file prior to visit.   No current facility-administered medications on file prior to visit.    No Known Allergies  Past Medical History  Diagnosis Date  . Colon cancer     Past Surgical History  Procedure Laterality Date  . Colectomy      right 1996  . Partial colectomy N/A 08/19/2013    Procedure: PARTIAL COLECTOMY;  Surgeon: [name], MD;  Location: [facility];  Service: General;  Laterality: N/A;    Family History  Problem Relation Age of Onset  . Colon cancer Father   . Colon cancer Brother   . Pancreatic cancer Neg Hx   . Stomach cancer Neg Hx   . Heart disease Neg Hx   . Diabetes Neg Hx     History   Social History  . Marital Status: Married    Spouse Name: N/A    Number of Children: 2  . Years of Education: N/A   Occupational History  . [employer]   Social History Main Topics  . Smoking status: Never Smoker   . Smokeless tobacco: Never Used  . Alcohol Use: No  . Drug Use: No  . Sexual Activity: Not on file   Other Topics Concern  . Not on file   Social History Narrative  . No narrative on file   Review of Systems  Constitutional: Negative for fatigue and unexpected weight change.       Wears seat belt   HENT: Negative for dental problem, hearing loss and tinnitus.        Regular with dentist  Eyes: Negative for visual disturbance.       No diplopia or unilateral vision loss    Respiratory: Negative for cough, chest tightness and shortness of breath.   Cardiovascular: Negative for chest pain, palpitations and leg swelling.  Gastrointestinal: Negative for nausea, vomiting, abdominal pain and blood in stool.  Endocrine: Negative for polydipsia and polyuria.  Genitourinary: Negative for frequency and difficulty urinating.       No sexual problems  Skin: Negative for rash.       No suspicious lesions  Allergic/Immunologic: Negative for environmental allergies and immunocompromised state.  Neurological: Positive for numbness. Negative for dizziness, syncope, weakness, light-headedness and headaches.       Some sense of numbness with pain in right arm  Hematological: Negative for adenopathy. Does not bruise/bleed easily.  Psychiatric/Behavioral: Positive for sleep disturbance. Negative for dysphoric mood. The patient is not nervous/anxious.        Chronic sleep problems--some daytime somnolence. No snoring or apnea per wife       Objective:   Physical Exam  Constitutional: He is oriented to person, place, and time. He appears well-developed and well-nourished. No distress.  HENT:  Head: Normocephalic and atraumatic.  Right Ear: External ear normal.  Left Ear: External ear normal.  Mouth/Throat: Oropharynx is clear and moist. No oropharyngeal exudate.  Eyes: Conjunctivae and EOM are normal. Pupils are equal, round, and reactive to light.  Neck: Normal range of motion. Neck supple. No thyromegaly present.  Cardiovascular: Normal rate, regular rhythm, normal heart sounds and intact distal pulses.  Exam reveals no gallop.   No murmur heard. Pulmonary/Chest: Effort normal and breath sounds normal. No respiratory distress. He has no wheezes. He has no rales.  Abdominal: Soft. There is no tenderness.  Musculoskeletal: He exhibits no edema and no tenderness.  Normal ROM of right shoulder and no tenderness  Lymphadenopathy:    He has no cervical adenopathy.   Neurological: He is alert and oriented to person, place, and time.  Skin: No rash noted. No erythema.  Psychiatric: He has a normal mood and affect. His behavior is normal.          Assessment & Plan:

## 2014-03-24 NOTE — Patient Instructions (Signed)
Make sure you are getting enough vitamin B12

## 2014-03-25 LAB — CBC WITH DIFFERENTIAL/PLATELET
BASOS ABS: 0 10*3/uL (ref 0.0–0.2)
Basos: 0 %
EOS: 1 %
Eosinophils Absolute: 0 10*3/uL (ref 0.0–0.4)
HCT: 44.9 % (ref 37.5–51.0)
HEMOGLOBIN: 15.1 g/dL (ref 12.6–17.7)
Immature Grans (Abs): 0 10*3/uL (ref 0.0–0.1)
Immature Granulocytes: 0 %
LYMPHS: 24 %
Lymphocytes Absolute: 2 10*3/uL (ref 0.7–3.1)
MCH: 28.7 pg (ref 26.6–33.0)
MCHC: 33.6 g/dL (ref 31.5–35.7)
MCV: 85 fL (ref 79–97)
Monocytes Absolute: 0.8 10*3/uL (ref 0.1–0.9)
Monocytes: 9 %
NEUTROS ABS: 5.4 10*3/uL (ref 1.4–7.0)
Neutrophils Relative %: 66 %
RBC: 5.26 x10E6/uL (ref 4.14–5.80)
RDW: 15.4 % (ref 12.3–15.4)
WBC: 8.2 10*3/uL (ref 3.4–10.8)

## 2014-03-25 LAB — COMPREHENSIVE METABOLIC PANEL
A/G RATIO: 2.4 (ref 1.1–2.5)
ALT: 18 IU/L (ref 0–44)
AST: 22 IU/L (ref 0–40)
Albumin: 5 g/dL — ABNORMAL HIGH (ref 3.6–4.8)
Alkaline Phosphatase: 79 IU/L (ref 39–117)
BUN/Creatinine Ratio: 10 (ref 10–22)
BUN: 9 mg/dL (ref 8–27)
CALCIUM: 9.9 mg/dL (ref 8.6–10.2)
CO2: 24 mmol/L (ref 18–29)
Chloride: 99 mmol/L (ref 97–108)
Creatinine, Ser: 0.94 mg/dL (ref 0.76–1.27)
GFR calc Af Amer: 100 mL/min/{1.73_m2} (ref >59)
GFR calc non Af Amer: 87 mL/min/{1.73_m2} (ref >59)
GLUCOSE: 86 mg/dL (ref 65–99)
Globulin, Total: 2.1 g/dL (ref 1.5–4.5)
POTASSIUM: 4.4 mmol/L (ref 3.5–5.2)
Sodium: 141 mmol/L (ref 134–144)
Total Bilirubin: 0.7 mg/dL (ref 0.0–1.2)
Total Protein: 7.1 g/dL (ref 6.0–8.5)

## 2014-03-25 LAB — T4, FREE: Free T4: 1.24 ng/dL (ref 0.82–1.77)

## 2014-03-25 LAB — PSA: PSA: 1 ng/mL (ref 0.0–4.0)

## 2014-03-29 ENCOUNTER — Encounter: Payer: Self-pay | Admitting: *Deleted

## 2014-04-12 ENCOUNTER — Ambulatory Visit (INDEPENDENT_AMBULATORY_CARE_PROVIDER_SITE_OTHER): Payer: 59 | Admitting: Cardiovascular Disease

## 2014-04-12 ENCOUNTER — Ambulatory Visit: Payer: Self-pay | Admitting: Cardiovascular Disease

## 2014-04-12 ENCOUNTER — Other Ambulatory Visit: Payer: Self-pay

## 2014-04-12 ENCOUNTER — Encounter: Payer: Self-pay | Admitting: Cardiovascular Disease

## 2014-04-12 VITALS — BP 121/83 | HR 66 | Ht 66.5 in | Wt 160.0 lb

## 2014-04-12 DIAGNOSIS — Z01812 Encounter for preprocedural laboratory examination: Secondary | ICD-10-CM

## 2014-04-12 DIAGNOSIS — C189 Malignant neoplasm of colon, unspecified: Secondary | ICD-10-CM

## 2014-04-12 DIAGNOSIS — I209 Angina pectoris, unspecified: Secondary | ICD-10-CM

## 2014-04-12 DIAGNOSIS — M79601 Pain in right arm: Secondary | ICD-10-CM

## 2014-04-12 DIAGNOSIS — R9439 Abnormal result of other cardiovascular function study: Secondary | ICD-10-CM

## 2014-04-12 DIAGNOSIS — E785 Hyperlipidemia, unspecified: Secondary | ICD-10-CM

## 2014-04-12 LAB — CBC WITH DIFFERENTIAL/PLATELET
BASOS PCT: 0.8 %
Basophil #: 0.1 10*3/uL (ref 0.0–0.1)
EOS ABS: 0 10*3/uL (ref 0.0–0.7)
EOS PCT: 0.6 %
HCT: 47.5 % (ref 40.0–52.0)
HGB: 15.2 g/dL (ref 13.0–18.0)
LYMPHS PCT: 20.6 %
Lymphocyte #: 1.8 10*3/uL (ref 1.0–3.6)
MCH: 29 pg (ref 26.0–34.0)
MCHC: 31.9 g/dL — ABNORMAL LOW (ref 32.0–36.0)
MCV: 91 fL (ref 80–100)
MONO ABS: 0.8 x10 3/mm (ref 0.2–1.0)
MONOS PCT: 9.1 %
NEUTROS ABS: 5.9 10*3/uL (ref 1.4–6.5)
NEUTROS PCT: 68.9 %
Platelet: 238 10*3/uL (ref 150–440)
RBC: 5.24 10*6/uL (ref 4.40–5.90)
RDW: 14.8 % — ABNORMAL HIGH (ref 11.5–14.5)
WBC: 8.6 10*3/uL (ref 3.8–10.6)

## 2014-04-12 LAB — BASIC METABOLIC PANEL
Anion Gap: 7 (ref 7–16)
BUN: 14 mg/dL (ref 7–18)
CO2: 28 mmol/L (ref 21–32)
Calcium, Total: 8.8 mg/dL (ref 8.5–10.1)
Chloride: 108 mmol/L — ABNORMAL HIGH (ref 98–107)
Creatinine: 0.99 mg/dL (ref 0.60–1.30)
EGFR (African American): >60
EGFR (Non-African Amer.): >60
Glucose: 94 mg/dL (ref 65–99)
OSMOLALITY: 285 (ref 275–301)
Potassium: 4.1 mmol/L (ref 3.5–5.1)
SODIUM: 143 mmol/L (ref 136–145)

## 2014-04-12 LAB — PROTIME-INR
INR: 1
Prothrombin Time: 13.1 secs (ref 11.5–14.7)

## 2014-04-12 MED ORDER — ASPIRIN EC 81 MG PO TBEC: 162.0000 mg | DELAYED_RELEASE_TABLET | Freq: Every day | ORAL | Status: DC

## 2014-04-12 NOTE — Patient Instructions (Signed)
Your physician has recommended you make the following change in your medication:  Start Aspirin 81 mg tablets. Take two tablets once daily.   [facility] Cardiac Cath Instructions   You are scheduled for a Cardiac Cath on:__10/30/15_______________________  Please arrive at _0730______am on the day of your procedure  You will need to pre-register prior to the day of your procedure.  Enter through the [employer] at [facility].  Registration is the first desk on your right.  Please take the procedure order we have given you in order to be registered appropriately  Do not eat/drink anything after midnight  Someone will need to drive you home  It is recommended someone be with you for the first 24 hours after your procedure  Wear clothes that are easy to get on/off and wear slip on shoes if possible   Medications bring a current list of all medications with you  _x__ You may take all of your medications the morning of your procedure with enough water to swallow safely     Day of your procedure: Arrive at the [place] entrance.  Free valet service is available.  After entering the [place] please check-in at the registration desk (1st desk on your right) to receive your armband. After receiving your armband someone will escort you to the cardiac cath/special procedures waiting area.  The usual length of stay after your procedure is about 2 to 3 hours.  This can vary.  If you have any questions, please call our office at [phone], or you may call the cardiac cath lab at [facility] directly at [phone]  Your physician recommends that you return for lab work in:  Please take orders for lab and chest x ray to [facility] to be completed as discussed

## 2014-04-12 NOTE — Procedures (Signed)
Exercise Treadmill Test  Treadmill ordered for recent epsiodes of chest pain, right arm pain.  Resting EKG shows NSR with rate of 66 bpm, no significant ST or T wave changes Resting blood pressure of 121/83. Standard bruce protocal was used.  Patient exercised for 5 min 01 sec,  Peak heart rate of 125 bpm.  This was 79% of the maximum predicted heart rate (158). Achieved 7.00 METS  he had symptoms of right-sided chest pain, right arm pain at peak stress and into recovery Peak Blood pressure recorded was 173/89 Heart rate at 3 minutes in recovery was 68 bpm. Significant ST depressions noted at less than 3 minutes, getting worse at peak exercise, 5 minutes. Test was terminated with ST depressions of -3 mm in leads V4 through V6 -2 mm ST depressions noted at 3-1/2 minutes EKG changes coincided with symptoms  FINAL IMPRESSION: Abnormal exercise stress test Significant EKG changes concerning for ischemia. EKG changes did persist into recovery, resolving at close to 7 minutes Results are discussed with the patient. Full evaluation was done in the clinic. Suggested was made to proceed with cardiac catheterization given his symptoms and dramatic EKG changes. Results will be sent to Dr. Silvio Pate, referring physician

## 2014-04-12 NOTE — Assessment & Plan Note (Signed)
Reports right arm pain, right chest pain since summer 2015, worse when walking long distances and exercising. Positive stress test on today's visit. Plan as above for cardiac catheterization later this week. Recommended he start aspirin daily

## 2014-04-12 NOTE — Progress Notes (Signed)
Patient ID: James Krueger, male    DOB: [birth year], 63 y.o.   MRN: [id number]  HPI Comments: Mr. Dinkins is a very pleasant gentleman with history of colon cancer, colectomy 1996, recurrence of his colon cancer recently with partial colectomy March 2015, presenting to primary care recently for routine physical, referred for routine treadmill study for right-sided chest pain starting this past summer. He is new to the [place] office  Reports that right-sided chest pain and right arm pain started over the summer while playing golf. Symptoms would occur while walking on the green. Now when he works out, unable to treadmill or bike for about 10 minutes before symptoms start. Otherwise reports that he is been taking care of himself. Eating well, with improvement of his cholesterol. He feels that with the treadmill and exercise, his chest pain was slightly better but has persisted.. he did not need is a muscular issue, symptoms resolved by stopping his exercise. Denies shortness of breath Denies any significant family history of coronary artery disease.  Denies any smoking history  Resting EKG shows normal sinus rhythm with rate 66 beats per minute, nonspecific ST abnormality in leads 2, aVF Treadmill study done in the office today showing dramatic ST abnormality in the anterolateral leads as well as inferior leads starting at 2 minutes. Treadmill terminated at 5 minutes for -3 millimeters depressions.   No Known Allergies    Past Medical History   .  Colon cancer      Past Surgical History   .  Colectomy           right 1996   .  Partial colectomy  N/A  08/19/2013       Procedure: PARTIAL COLECTOMY;  Surgeon: [name], MD;  Location: [facility];  Service: General;  Laterality: N/A;     Family History   .  Colon cancer  Father     .  Colon cancer  Brother     .  Pancreatic cancer  Neg Hx     .  Stomach cancer  Neg Hx     .  Heart disease  Neg Hx     .  Diabetes  Neg Hx       Social  History   .  Marital Status:  Married       Spouse Name:  N/A       Number of Children:  2   .  Years of Education:  N/A     Occupational History   .  [employer]    Social History Main Topics   .  Smoking status:  Never Smoker    .  Smokeless tobacco:  Never Used   .  Alcohol Use:  No   .  Drug Use:  No   .  Sexual Activity:  Not on file         Outpatient Encounter Prescriptions as of 04/12/2014  Medication Sig  . aspirin EC 81 MG tablet Take 2 tablets (162 mg total) by mouth daily.    Review of Systems  Constitutional: Negative.   HENT: Negative.   Eyes: Negative.   Respiratory: Positive for chest tightness.   Cardiovascular: Positive for chest pain.       Symptoms on the right side with exertion  Gastrointestinal: Negative.   Endocrine: Negative.   Musculoskeletal: Negative.   Skin: Negative.   Allergic/Immunologic: Negative.   Neurological: Negative.   Hematological: Negative.   Psychiatric/Behavioral: Negative.  All other systems reviewed and are negative.   BP 121/83  Pulse 66  Ht 5' 6.5" (1.689 m)  Wt 160 lb (72.576 kg)  BMI 25.44 kg/m2  Physical Exam  Nursing note and vitals reviewed. Constitutional: He is oriented to person, place, and time. He appears well-developed and well-nourished.  HENT:  Head: Normocephalic.  Nose: Nose normal.  Mouth/Throat: Oropharynx is clear and moist.  Eyes: Conjunctivae are normal. Pupils are equal, round, and reactive to light.  Neck: Normal range of motion. Neck supple. No JVD present.  Cardiovascular: Normal rate, regular rhythm, S1 normal, S2 normal, normal heart sounds and intact distal pulses.  Exam reveals no gallop and no friction rub.   No murmur heard. Pulmonary/Chest: Effort normal and breath sounds normal. No respiratory distress. He has no wheezes. He has no rales. He exhibits no tenderness.  Abdominal: Soft. Bowel sounds are normal. He exhibits no distension. There is no tenderness.   Musculoskeletal: Normal range of motion. He exhibits no edema and no tenderness.  Lymphadenopathy:    He has no cervical adenopathy.  Neurological: He is alert and oriented to person, place, and time. Coordination normal.  Skin: Skin is warm and dry. No rash noted. No erythema.  Psychiatric: He has a normal mood and affect. His behavior is normal. Judgment and thought content normal.      Assessment and Plan

## 2014-04-12 NOTE — Assessment & Plan Note (Signed)
Dramatically positive stress test on his study performed in the office today. Results were discussed with him in detail. Full history obtained. He is low risk for coronary artery disease but does have dramatic ST changes on treadmill with associated symptoms concerning for angina. Symptoms seem to coincide with EKG changes. Test was terminated prematurely given the dramatic changes. After a long discussion about his various treatment options, we have recommended cardiac catheterization given his symptoms and EKG changes. He is willing to proceed with catheterization. This will be scheduled for Friday  October 30 at [facility]. Risk and benefits were discussed with him including groin bleeding, heart attack and stroke.

## 2014-04-12 NOTE — Assessment & Plan Note (Signed)
Will defer on any decision concerning his cholesterol pending the catheterization

## 2014-04-13 ENCOUNTER — Telehealth: Payer: Self-pay | Admitting: Cardiovascular Disease

## 2014-04-13 NOTE — Telephone Encounter (Signed)
Cath on Friday and had some questions.  1. Never mentioned what's the recovery on this?  When are you able to walk and exercise?  2.as there putting the tube in, can they see it, mentioned to him that there is no camera along putting it in? 3. If they put in a stent? What is it made of? How it is put in?   Please call patient.

## 2014-04-13 NOTE — Telephone Encounter (Signed)
Can we provided him with some information on cardiac catheterization Recovery would be no heavy exertion for 5 days I can show pictures when they are done, not during the case Small gauge tubing made a plastic goes inside the aorta up to the heart, dye injected Stent is made of Nitinol metal, special alloy

## 2014-04-14 NOTE — Telephone Encounter (Signed)
Spoke w/ pt.  Advised him of Dr. Donivan Scull recommendations.  He verbalizes understanding. Pt would like to know if he can walk on his treadmill the day after his cath.  Advised him that this will be discussed before he is d/c'd from recovery.  Asked pt to call back w/ any further questions or concerns.

## 2014-04-16 ENCOUNTER — Ambulatory Visit: Payer: Self-pay | Admitting: Cardiovascular Disease

## 2014-04-16 DIAGNOSIS — I2510 Atherosclerotic heart disease of native coronary artery without angina pectoris: Secondary | ICD-10-CM

## 2014-04-16 HISTORY — PX: CARDIAC CATHETERIZATION: SHX172

## 2014-04-23 ENCOUNTER — Encounter: Payer: Self-pay | Admitting: Cardiovascular Disease

## 2014-05-05 ENCOUNTER — Ambulatory Visit (INDEPENDENT_AMBULATORY_CARE_PROVIDER_SITE_OTHER): Payer: 59 | Admitting: Cardiovascular Disease

## 2014-05-05 ENCOUNTER — Encounter: Payer: Self-pay | Admitting: Cardiovascular Disease

## 2014-05-05 VITALS — BP 120/70 | HR 70 | Ht 67.0 in | Wt 165.2 lb

## 2014-05-05 DIAGNOSIS — I240 Acute coronary thrombosis not resulting in myocardial infarction: Secondary | ICD-10-CM

## 2014-05-05 DIAGNOSIS — I2111 ST elevation (STEMI) myocardial infarction involving right coronary artery: Secondary | ICD-10-CM

## 2014-05-05 DIAGNOSIS — I208 Other forms of angina pectoris: Secondary | ICD-10-CM

## 2014-05-05 DIAGNOSIS — R9439 Abnormal result of other cardiovascular function study: Secondary | ICD-10-CM

## 2014-05-05 DIAGNOSIS — I25119 Atherosclerotic heart disease of native coronary artery with unspecified angina pectoris: Secondary | ICD-10-CM

## 2014-05-05 DIAGNOSIS — Z955 Presence of coronary angioplasty implant and graft: Secondary | ICD-10-CM

## 2014-05-05 DIAGNOSIS — E785 Hyperlipidemia, unspecified: Secondary | ICD-10-CM

## 2014-05-05 MED ORDER — ATORVASTATIN CALCIUM 10 MG PO TABS: 10.0000 mg | ORAL_TABLET | Freq: Every day | ORAL | Status: DC

## 2014-05-05 NOTE — Patient Instructions (Signed)
You are doing well. Please start atorvastatin one a day (night)  Read about benefits of b-blockers in people with coronary artery disease/heart attack  Labs in 3 months  Please call us if you have new issues that need to be addressed before your next appt.  Your physician wants you to follow-up in: 6 months.  You will receive a reminder letter in the mail two months in advance. If you don't receive a letter, please call our office to schedule the follow-up appointment.

## 2014-05-05 NOTE — Assessment & Plan Note (Signed)
He continues to have angina with heavy exertion, typically will improve. With continued development of his collaterals from left to right, this should improve

## 2014-05-05 NOTE — Assessment & Plan Note (Signed)
Long discussion today concerning the coronary anatomy, collaterals from left to right. Stress importance of continued aggressive cholesterol management

## 2014-05-05 NOTE — Assessment & Plan Note (Signed)
Recommended that he start Lipitor 10 mg daily. Repeat lipid panel ordered in 3 months time

## 2014-05-05 NOTE — Progress Notes (Signed)
Patient ID: James Krueger, male    DOB: Sep 15, 1950, 63 y.o.   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 In fact typically if he continues to exert himself, he has less symptoms. He has lost significant weight over the past several years, dramatic drop in his cholesterol which was previously 250, now in the 170 range He is watching what he eats, follows a very strict diet He has lots of questions today concerning his coronary anatomy  Denies any significant family history of coronary artery disease.  Denies any smoking history   EKG in the office today shows normal sinus rhythm with rate 70 beats per minute, nonspecific ST abnormality in inferior leads  Previous Treadmill study done in the office today showing dramatic ST abnormality in the anterolateral leads as well as inferior leads starting at 2 minutes. Treadmill terminated at 5 minutes for -3 millimeters depressions.  No Known Allergies    Past Medical History   .  Colon cancer      Past Surgical History   .  Colectomy           right 1996   .  Partial colectomy  N/A  08/19/2013       Procedure: PARTIAL COLECTOMY;  Surgeon: [name], MD;  Location: [facility];  Service: General;  Laterality: N/A;     Family History   .  Colon cancer  Father     .  Colon  cancer  Brother     .  Pancreatic cancer  Neg Hx     .  Stomach cancer  Neg Hx     .  Heart disease  Neg Hx     .  Diabetes  Neg Hx       Social History   .  Marital Status:  Married       Spouse Name:  N/A       Number of Children:  2   .  Years of Education:  N/A     Occupational History   .  [employer]    Social History Main Topics   .  Smoking status:  Never Smoker    .  Smokeless tobacco:  Never Used   .  Alcohol Use:  No   .  Drug Use:  No   .  Sexual Activity:  Not on file         Outpatient Encounter Prescriptions as of 05/05/2014  Medication Sig  . aspirin EC 81 MG tablet Take 2 tablets (162 mg total) by mouth daily.    Review of Systems  Constitutional: Negative.   Respiratory: Negative.   Cardiovascular: Negative.        Symptoms on the right side with exertion  Gastrointestinal: Negative.   Musculoskeletal: Negative.   Skin: Negative.   Neurological: Negative.   Hematological: Negative.   All  other systems reviewed and are negative.   BP 120/70 mmHg  Pulse 70  Ht 5\' 7"  (1.702 m)  Wt 165 lb 4 oz (74.957 kg)  BMI 25.88 kg/m2  Physical Exam  Constitutional: He is oriented to person, place, and time. He appears well-developed and well-nourished.  HENT:  Head: Normocephalic.  Nose: Nose normal.  Mouth/Throat: Oropharynx is clear and moist.  Eyes: Conjunctivae are normal. Pupils are equal, round, and reactive to light.  Neck: Normal range of motion. Neck supple. No JVD present.  Cardiovascular: Normal rate, regular rhythm, S1 normal, S2 normal, normal heart sounds and intact distal pulses.  Exam reveals no gallop and no friction rub.   No murmur heard. Pulmonary/Chest: Effort normal and breath sounds normal. No respiratory distress. He has no wheezes. He has no rales. He exhibits no tenderness.  Abdominal: Soft. Bowel sounds are normal. He exhibits no distension. There is no tenderness.  Musculoskeletal: Normal range of motion. He  exhibits no edema or tenderness.  Lymphadenopathy:    He has no cervical adenopathy.  Neurological: He is alert and oriented to person, place, and time. Coordination normal.  Skin: Skin is warm and dry. No rash noted. No erythema.  Psychiatric: He has a normal mood and affect. His behavior is normal. Judgment and thought content normal.      Assessment and Plan   Nursing note and vitals reviewed.

## 2014-05-05 NOTE — Assessment & Plan Note (Signed)
Occluded RCA and 70% disease of a smaller diagonal branch. Medical management recommended. Suggested he start Lipitor 10 mg daily goal LDL less than 70 Continue aspirin He does not want additional medication such as beta blockers

## 2014-07-14 ENCOUNTER — Encounter: Payer: Self-pay | Admitting: Gastroenterology

## 2014-07-22 ENCOUNTER — Encounter: Payer: Self-pay | Admitting: Gastroenterology

## 2014-09-09 ENCOUNTER — Ambulatory Visit (AMBULATORY_SURGERY_CENTER): Payer: Self-pay

## 2014-09-09 VITALS — Ht 67.0 in | Wt 164.0 lb

## 2014-09-09 DIAGNOSIS — Z85038 Personal history of other malignant neoplasm of large intestine: Secondary | ICD-10-CM

## 2014-09-09 MED ORDER — MOVIPREP 100 G PO SOLR: 1.0000 | Freq: Once | ORAL | Status: DC

## 2014-09-09 NOTE — Progress Notes (Signed)
No allergies to eggs or soy No diet/weight loss meds No home oxygen No past problems with anesthesia  Has email  Emmi instructions given for colonoscopy 

## 2014-09-24 ENCOUNTER — Encounter: Payer: Self-pay | Admitting: Gastroenterology

## 2014-09-24 ENCOUNTER — Ambulatory Visit (AMBULATORY_SURGERY_CENTER): Payer: 59 | Admitting: Gastroenterology

## 2014-09-24 VITALS — BP 132/74 | HR 54 | Temp 96.4°F | Resp 47 | Ht 67.0 in | Wt 164.0 lb

## 2014-09-24 DIAGNOSIS — D125 Benign neoplasm of sigmoid colon: Secondary | ICD-10-CM

## 2014-09-24 DIAGNOSIS — D123 Benign neoplasm of transverse colon: Secondary | ICD-10-CM | POA: Diagnosis not present

## 2014-09-24 DIAGNOSIS — Z85038 Personal history of other malignant neoplasm of large intestine: Secondary | ICD-10-CM

## 2014-09-24 MED ORDER — SODIUM CHLORIDE 0.9 % IV SOLN: 500.0000 mL | INTRAVENOUS | Status: DC

## 2014-09-24 NOTE — Progress Notes (Signed)
Called to room to assist during endoscopic procedure.  Patient ID and intended procedure confirmed with present staff. Received instructions for my participation in the procedure from the performing physician.  

## 2014-09-24 NOTE — Patient Instructions (Signed)
YOU HAD AN ENDOSCOPIC PROCEDURE TODAY AT [facility]:   Refer to the procedure report that was given to you for any specific questions about what was found during the examination.  If the procedure report does not answer your questions, please call your gastroenterologist to clarify.  If you requested that your care partner not be given the details of your procedure findings, then the procedure report has been included in a sealed envelope for you to review at your convenience later.  YOU SHOULD EXPECT: Some feelings of bloating in the abdomen. Passage of more gas than usual.  Walking can help get rid of the air that was put into your GI tract during the procedure and reduce the bloating. If you had a lower endoscopy (such as a colonoscopy or flexible sigmoidoscopy) you may notice spotting of blood in your stool or on the toilet paper. If you underwent a bowel prep for your procedure, you may not have a normal bowel movement for a few days.  Please Note:  You might notice some irritation and congestion in your nose or some drainage.  This is from the oxygen used during your procedure.  There is no need for concern and it should clear up in a day or so.  SYMPTOMS TO REPORT IMMEDIATELY:   Following lower endoscopy (colonoscopy or flexible sigmoidoscopy):  Excessive amounts of blood in the stool  Significant tenderness or worsening of abdominal pains  Swelling of the abdomen that is new, acute  Fever of 100F or higher   For urgent or emergent issues, a gastroenterologist can be reached at any hour by calling [phone].   DIET: Your first meal following the procedure should be a small meal and then it is ok to progress to your normal diet. Heavy or fried foods are harder to digest and may make you feel nauseous or bloated.  Likewise, meals heavy in dairy and vegetables can increase bloating.  Drink plenty of fluids but you should avoid alcoholic beverages for 24  hours.  ACTIVITY:  You should plan to take it easy for the rest of today and you should NOT DRIVE or use heavy machinery until tomorrow (because of the sedation medicines used during the test).    FOLLOW UP: Our staff will call the number listed on your records the next business day following your procedure to check on you and address any questions or concerns that you may have regarding the information given to you following your procedure. If we do not reach you, we will leave a message.  However, if you are feeling well and you are not experiencing any problems, there is no need to return our call.  We will assume that you have returned to your regular daily activities without incident.  If any biopsies were taken you will be contacted by phone or by letter within the next 1-3 weeks.  Please call us at [phone] if you have not heard about the biopsies in 3 weeks.    SIGNATURES/CONFIDENTIALITY: You and/or your care partner have signed paperwork which will be entered into your electronic medical record.  These signatures attest to the fact that that the information above on your After Visit Summary has been reviewed and is understood.  Full responsibility of the confidentiality of this discharge information lies with you and/or your care-partner.  Recommendations Discharge instructions given to patient and/or care partner. Next colonoscopy in 1 year. [place] handout provided.

## 2014-09-24 NOTE — Progress Notes (Signed)
Report to PACU, RN, vss, BBS= Clear.  

## 2014-09-24 NOTE — Op Note (Signed)
[place]  [employer] [place], [id number]   COLONOSCOPY PROCEDURE REPORT  PATIENT: James Krueger, James Krueger  MR#: [id number] BIRTHDATE: 1950/06/21 , 10  yrs. old GENDER: male ENDOSCOPIST: Ladene Artist, MD, [facility] PROCEDURE DATE:  09/24/2014 PROCEDURE:   Colonoscopy, surveillance , Colonoscopy with biopsy, and Colonoscopy with snare polypectomy First Screening Colonoscopy - Avg.  risk and is 50 yrs.  old or older - No.  Prior Negative Screening - Now for repeat screening. N/A  History of Adenoma - Now for follow-up colonoscopy & has been > or = to 3 yrs.  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 MEDICATIONS: Monitored anesthesia care and Propofol 200 mg IV DESCRIPTION OF PROCEDURE:   After the risks benefits and alternatives of the procedure were thoroughly explained, informed consent was obtained.  The digital rectal exam revealed no abnormalities of the rectum.   The LB [id number]  endoscope was introduced through the anus and advanced to the surgical anastomosis. No adverse events experienced.   The quality of the prep was excellent.  (MoviPrep was used)  The instrument was then slowly withdrawn as the colon was fully examined.    COLON FINDINGS: There was evidence of a prior colo-colonic surgical anastomosis in the transverse colon.   A sessile polyp measuring 4 mm in size was found in the transverse colon.  A polypectomy was performed with cold forceps.  The resection was complete, the polyp tissue was completely retrieved and sent to histology.   A sessile polyp measuring 6 mm in size was found in the sigmoid colon next to prior tattoo.  A polypectomy was performed with a cold snare.  The resection was complete, the polyp tissue was completely retrieved and sent to histology.   The examination was otherwise normal. Retroflexed views revealed no abnormalities. The time to cecum  = 1.8 Withdrawal time = 10.1   The scope was withdrawn and the procedure completed. COMPLICATIONS: There were no immediate complications.  ENDOSCOPIC IMPRESSION: 1.   Prior colo-colonic surgical anastomosis in the transverse colon  2.   Sessile polyp in the transverse colon; polypectomy performed with cold forceps 3.   Sessile polyp in the sigmoid colon; polypectomy performed with a cold snare  RECOMMENDATIONS: 1.  Await pathology results 2.  Repeat Colonoscopy in 1 year.  eSigned:  Ladene Artist, MD, [facility] 09/24/2014 8:25 AM   cc: Armandina Gemma, MD

## 2014-09-27 ENCOUNTER — Telehealth: Payer: Self-pay

## 2014-09-27 NOTE — Telephone Encounter (Signed)
Left message on answering machine. 

## 2014-10-02 ENCOUNTER — Encounter: Payer: Self-pay | Admitting: Gastroenterology

## 2015-01-24 IMAGING — CR DG CHEST 2V
1 series · 2 of 2 positions shown · non-contrast
Comparison: CT chest 08/06/2013.

CLINICAL DATA: Pre cardiac catheterization.

EXAM:
CHEST  2 VIEW

[Series 1: dxr chest pa (or ap) and lateral · 0.14mm/px · 2 of 2 slices shown]
[im 1/2]
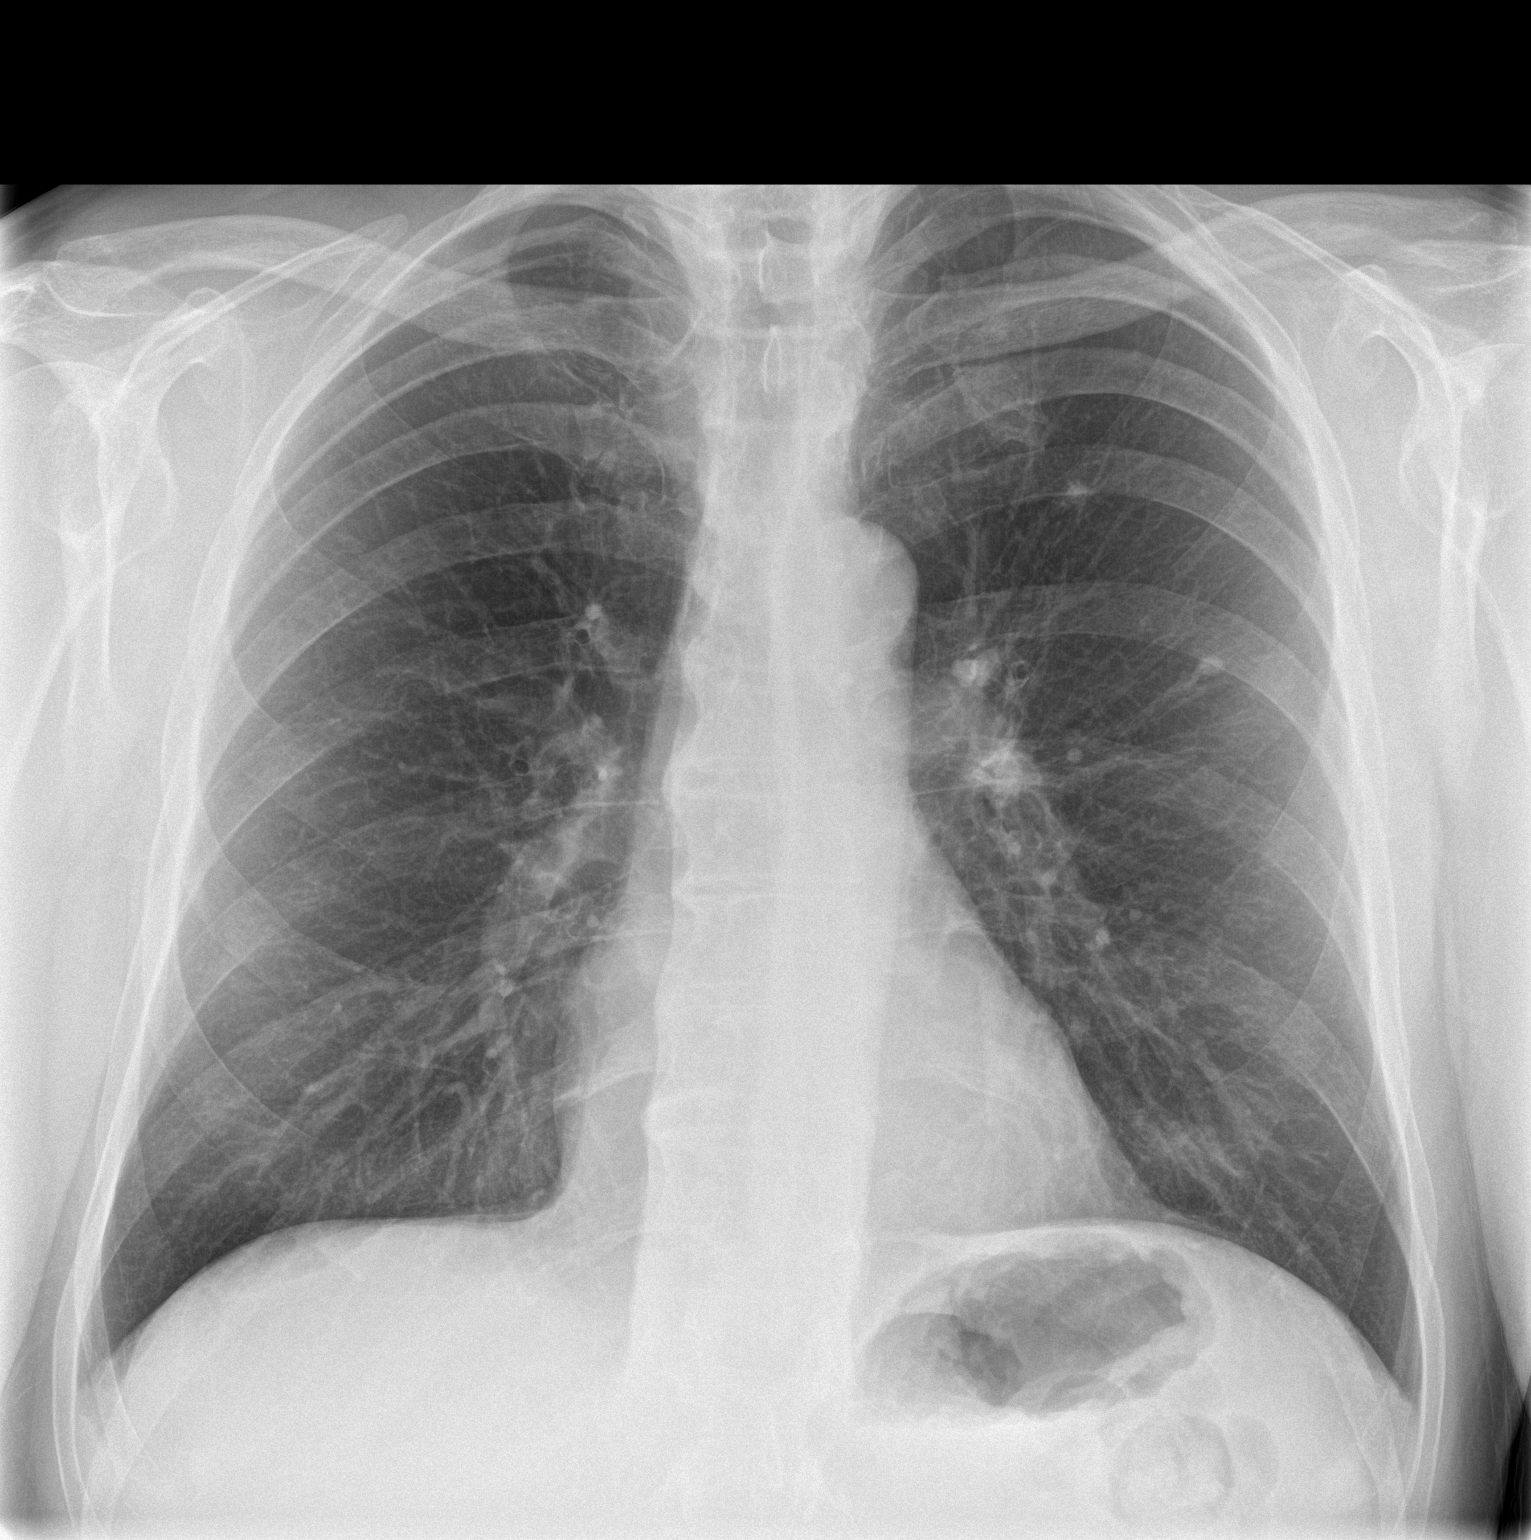
[im 2/2]
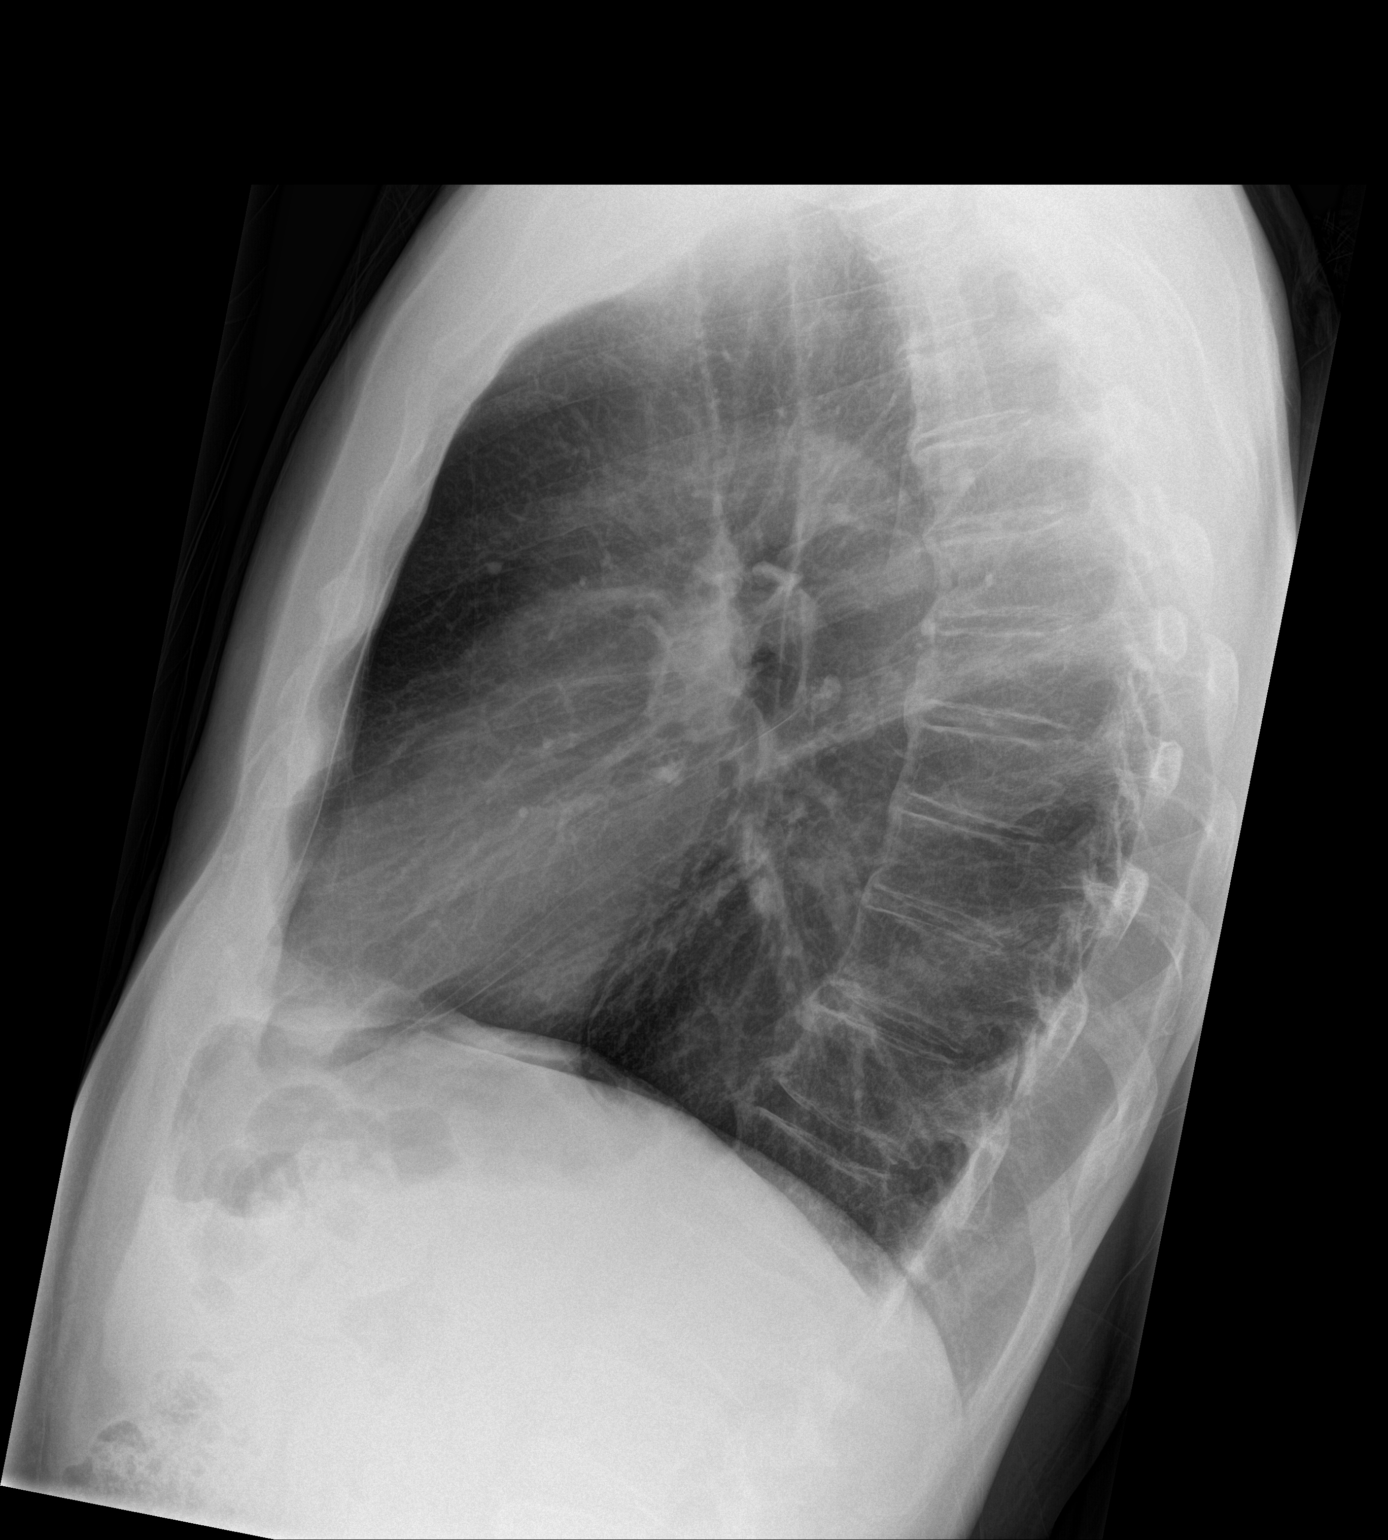

[2 of 2 positions shown; findings below may reference images not displayed]

FINDINGS: Trachea is midline. Heart size normal. Calcified granulomas are seen
in the upper left hemi thorax. Lungs are somewhat hyperinflated but
otherwise clear. No pleural fluid. Flowing anterior osteophytosis in
the thoracic spine.
IMPRESSION: No acute findings.

## 2015-03-03 ENCOUNTER — Encounter: Payer: Self-pay | Admitting: Gastroenterology

## 2015-04-08 ENCOUNTER — Telehealth: Payer: Self-pay | Admitting: Cardiovascular Disease

## 2015-04-08 NOTE — Telephone Encounter (Signed)
3 attempts made to schedule OD fu for patient.   Patient is Not interested in scheduling per conversation via phone.   Deleting recall.

## 2015-08-16 ENCOUNTER — Encounter: Payer: Self-pay | Admitting: Gastroenterology

## 2015-08-22 ENCOUNTER — Encounter: Payer: Self-pay | Admitting: Gastroenterology

## 2015-10-07 ENCOUNTER — Ambulatory Visit (AMBULATORY_SURGERY_CENTER): Payer: Self-pay | Admitting: *Deleted

## 2015-10-07 VITALS — Ht 67.0 in | Wt 163.0 lb

## 2015-10-07 DIAGNOSIS — Z85038 Personal history of other malignant neoplasm of large intestine: Secondary | ICD-10-CM

## 2015-10-07 MED ORDER — NA SULFATE-K SULFATE-MG SULF 17.5-3.13-1.6 GM/177ML PO SOLN: 1.0000 | Freq: Once | ORAL | Status: DC

## 2015-10-07 NOTE — Progress Notes (Signed)
No egg or soy allergy known to patient  No issues with past sedation with any surgeries  or procedures, no intubation problems  No diet pills per patient No home 02 use per patient  No blood thinners per patient  Pt denies issues with constipation   

## 2015-10-21 ENCOUNTER — Ambulatory Visit (AMBULATORY_SURGERY_CENTER): Payer: 59 | Admitting: Gastroenterology

## 2015-10-21 ENCOUNTER — Encounter: Payer: Self-pay | Admitting: Gastroenterology

## 2015-10-21 VITALS — BP 117/68 | HR 63 | Temp 98.0°F | Resp 14 | Ht 67.0 in | Wt 163.0 lb

## 2015-10-21 DIAGNOSIS — D123 Benign neoplasm of transverse colon: Secondary | ICD-10-CM | POA: Diagnosis not present

## 2015-10-21 DIAGNOSIS — Z85038 Personal history of other malignant neoplasm of large intestine: Secondary | ICD-10-CM | POA: Diagnosis present

## 2015-10-21 MED ORDER — SODIUM CHLORIDE 0.9 % IV SOLN: 500.0000 mL | INTRAVENOUS | Status: DC

## 2015-10-21 NOTE — Patient Instructions (Signed)
YOU HAD AN ENDOSCOPIC PROCEDURE TODAY AT [facility]:   Refer to the procedure report that was given to you for any specific questions about what was found during the examination.  If the procedure report does not answer your questions, please call your gastroenterologist to clarify.  If you requested that your care partner not be given the details of your procedure findings, then the procedure report has been included in a sealed envelope for you to review at your convenience later.  YOU SHOULD EXPECT: Some feelings of bloating in the abdomen. Passage of more gas than usual.  Walking can help get rid of the air that was put into your GI tract during the procedure and reduce the bloating. If you had a lower endoscopy (such as a colonoscopy or flexible sigmoidoscopy) you may notice spotting of blood in your stool or on the toilet paper. If you underwent a bowel prep for your procedure, you may not have a normal bowel movement for a few days.  Please Note:  You might notice some irritation and congestion in your nose or some drainage.  This is from the oxygen used during your procedure.  There is no need for concern and it should clear up in a day or so.  SYMPTOMS TO REPORT IMMEDIATELY:   Following lower endoscopy (colonoscopy or flexible sigmoidoscopy):  Excessive amounts of blood in the stool  Significant tenderness or worsening of abdominal pains  Swelling of the abdomen that is new, acute  Fever of 100F or higher   For urgent or emergent issues, a gastroenterologist can be reached at any hour by calling [phone].   DIET: Your first meal following the procedure should be a small meal and then it is ok to progress to your normal diet. Heavy or fried foods are harder to digest and may make you feel nauseous or bloated.  Likewise, meals heavy in dairy and vegetables can increase bloating.  Drink plenty of fluids but you should avoid alcoholic beverages for 24  hours.  ACTIVITY:  You should plan to take it easy for the rest of today and you should NOT DRIVE or use heavy machinery until tomorrow (because of the sedation medicines used during the test).    FOLLOW UP: Our staff will call the number listed on your records the next business day following your procedure to check on you and address any questions or concerns that you may have regarding the information given to you following your procedure. If we do not reach you, we will leave a message.  However, if you are feeling well and you are not experiencing any problems, there is no need to return our call.  We will assume that you have returned to your regular daily activities without incident.  If any biopsies were taken you will be contacted by phone or by letter within the next 1-3 weeks.  Please call us at [phone] if you have not heard about the biopsies in 3 weeks.    SIGNATURES/CONFIDENTIALITY: You and/or your care partner have signed paperwork which will be entered into your electronic medical record.  These signatures attest to the fact that that the information above on your After Visit Summary has been reviewed and is understood.  Full responsibility of the confidentiality of this discharge information lies with you and/or your care-partner.  Polyp and hemorrhoid information given.  Repeat in one year.

## 2015-10-21 NOTE — Op Note (Signed)
[facility] Patient Name: James Krueger Procedure Date: 10/21/2015 8:48 AM MRN: [id number] Endoscopist: Ladene Artist , MD Age: 65 Date of Birth: 14-May-1951 Gender: Male Procedure:                Colonoscopy Indications:              High risk colon cancer surveillance: Personal                            history of colon cancer Medicines:                Monitored Anesthesia Care Procedure:                Pre-Anesthesia Assessment:                           - Prior to the procedure, a History and Physical                            was performed, and patient medications and                            allergies were reviewed. The patient's tolerance of                            previous anesthesia was also reviewed. The risks                            and benefits of the procedure and the sedation                            options and risks were discussed with the patient.                            All questions were answered, and informed consent                            was obtained. Prior Anticoagulants: The patient has                            taken no previous anticoagulant or antiplatelet                            agents. ASA Grade Assessment: II - A patient with                            mild systemic disease. After reviewing the risks                            and benefits, the patient was deemed in                            satisfactory condition to undergo the procedure.  After obtaining informed consent, the colonoscope                            was passed under direct vision. Throughout the                            procedure, the patient's blood pressure, pulse, and                            oxygen saturations were monitored continuously. The                            Model PCF-H190L [id number]) scope was introduced                            through the anus and advanced to the the terminal   ileum. The colonoscopy was performed without                            difficulty. The patient tolerated the procedure                            well. The quality of the bowel preparation was                            excellent. The terminal ileum and the rectum were                            photographed. Scope In: 8:56:00 AM Scope Out: 9:06:24 AM Scope Withdrawal Time: 0 hours 9 minutes 20 seconds  Total Procedure Duration: 0 hours 10 minutes 24 seconds  Findings:                 The digital rectal exam was normal.                           A 8 mm polyp was found in the transverse colon. The                            polyp was sessile. The polyp was removed with a                            cold snare. Resection and retrieval were complete.                           A tattoo was seen in the sigmoid colon. A                            post-polypectomy scar was found at the tattoo site.                           There was evidence of a prior end-to-side  ileo-colonic anastomosis in the transverse colon.                            This was patent. The anastomosis was traversed.                           The neo-terminal ileum appeared normal.                           Internal hemorrhoids were found during                            retroflexion. The hemorrhoids were small and Grade                            I (internal hemorrhoids that do not prolapse).                           The exam was otherwise normal throughout the                            examined colon. Complications:            No immediate complications. Estimated Blood Loss:     Estimated blood loss: none. Impression:               - One 8 mm polyp in the transverse colon, removed                            with a cold snare. Resected and retrieved.                           - A tattoo was seen in the sigmoid colon. A                            post-polypectomy scar was found at the tattoo  site.                           - Patent end-to-side ileo-colonic anastomosis.                           - The examined portion of the ileum was normal.                           - Internal hemorrhoids. Recommendation:           - Patient has a contact number available for                            emergencies. The signs and symptoms of potential                            delayed complications were discussed with the                            patient. Return to  normal activities tomorrow.                            Written discharge instructions were provided to the                            patient.                           - Resume previous diet.                           - Continue present medications.                           - Await pathology results.                           - Repeat colonoscopy in 1 year for surveillance.                           - Patient has a contact number available for                            emergencies. The signs and symptoms of potential                            delayed complications were discussed with the                            patient. Return to normal activities tomorrow.                            Written discharge instructions were provided to the                            patient. Ladene Artist, MD 10/21/2015 9:12:25 AM This report has been signed electronically.

## 2015-10-21 NOTE — Progress Notes (Signed)
Report to PACU, RN, vss, BBS= Clear.  

## 2015-10-21 NOTE — Progress Notes (Signed)
Called to room to assist during endoscopic procedure.  Patient ID and intended procedure confirmed with present staff. Received instructions for my participation in the procedure from the performing physician.  

## 2015-10-24 ENCOUNTER — Telehealth: Payer: Self-pay

## 2015-10-24 NOTE — Telephone Encounter (Signed)
  Follow up Call-  Call back number 10/21/2015 09/24/2014 07/31/2013  Post procedure Call Back phone  # [phone]  Permission to leave phone message Yes Yes Yes    Patient was called for follow up after his procedure on 10/21/2015. No answer at the number given for follow up phone call. A message was left on the answering machine.

## 2015-10-31 ENCOUNTER — Encounter: Payer: Self-pay | Admitting: Gastroenterology

## 2016-09-12 ENCOUNTER — Encounter: Payer: Self-pay | Admitting: Gastroenterology

## 2016-09-24 ENCOUNTER — Encounter: Payer: Self-pay | Admitting: Gastroenterology

## 2016-11-13 ENCOUNTER — Ambulatory Visit (AMBULATORY_SURGERY_CENTER): Payer: Self-pay

## 2016-11-13 VITALS — Ht 67.0 in | Wt 164.8 lb

## 2016-11-13 DIAGNOSIS — Z800 Family history of malignant neoplasm of digestive organs: Secondary | ICD-10-CM

## 2016-11-13 DIAGNOSIS — Z85038 Personal history of other malignant neoplasm of large intestine: Secondary | ICD-10-CM

## 2016-11-13 MED ORDER — NA SULFATE-K SULFATE-MG SULF 17.5-3.13-1.6 GM/177ML PO SOLN
ORAL | 0 refills | Status: DC
Start: 2016-11-13 — End: 2016-11-27

## 2016-11-13 NOTE — Progress Notes (Signed)
Per pt, no allergies to soy or egg products.Pt not taking any weight loss meds or using  O2 at home.   Pt refused Emmi video. 

## 2016-11-15 ENCOUNTER — Encounter: Payer: Self-pay | Admitting: Gastroenterology

## 2016-11-27 ENCOUNTER — Ambulatory Visit (AMBULATORY_SURGERY_CENTER): Payer: 59 | Admitting: Gastroenterology

## 2016-11-27 ENCOUNTER — Encounter: Payer: Self-pay | Admitting: Gastroenterology

## 2016-11-27 VITALS — BP 130/77 | HR 54 | Temp 97.3°F | Resp 11 | Ht 67.0 in | Wt 165.0 lb

## 2016-11-27 DIAGNOSIS — D123 Benign neoplasm of transverse colon: Secondary | ICD-10-CM | POA: Diagnosis not present

## 2016-11-27 DIAGNOSIS — Z85038 Personal history of other malignant neoplasm of large intestine: Secondary | ICD-10-CM | POA: Diagnosis present

## 2016-11-27 MED ORDER — SODIUM CHLORIDE 0.9 % IV SOLN
500.0000 mL | INTRAVENOUS | Status: DC
Start: 2016-11-27 — End: 2018-12-15

## 2016-11-27 NOTE — Progress Notes (Signed)
Called to room to assist during endoscopic procedure.  Patient ID and intended procedure confirmed with present staff. Received instructions for my participation in the procedure from the performing physician.  

## 2016-11-27 NOTE — Op Note (Signed)
[place] Patient Name: James Krueger Procedure Date: 11/27/2016 7:59 AM MRN: [id number] Endoscopist: Ladene Artist , MD Age: 66 Referring MD:  Date of Birth: 1950-06-22 Gender: Male Account #: [id number] Procedure:                Colonoscopy Indications:              High risk colon cancer surveillance: Personal                            history of colon cancer Medicines:                Monitored Anesthesia Care Procedure:                Pre-Anesthesia Assessment:                           - Prior to the procedure, a History and Physical                            was performed, and patient medications and                            allergies were reviewed. The patient's tolerance of                            previous anesthesia was also reviewed. The risks                            and benefits of the procedure and the sedation                            options and risks were discussed with the patient.                            All questions were answered, and informed consent                            was obtained. Prior Anticoagulants: The patient has                            taken no previous anticoagulant or antiplatelet                            agents. ASA Grade Assessment: II - A patient with                            mild systemic disease. After reviewing the risks                            and benefits, the patient was deemed in                            satisfactory condition to undergo the procedure.  After obtaining informed consent, the colonoscope                            was passed under direct vision. Throughout the                            procedure, the patient's blood pressure, pulse, and                            oxygen saturations were monitored continuously. The                            Model PCF-H190DL [id number]) scope was introduced                            through the anus and advanced to the the                             ileocolonic anastomosis. The ileocecal valve,                            appendiceal orifice, and rectum were photographed.                            The quality of the bowel preparation was excellent.                            The colonoscopy was performed without difficulty.                            The patient tolerated the procedure well. Scope In: 8:07:04 AM Scope Out: 8:17:34 AM Scope Withdrawal Time: 0 hours 8 minutes 55 seconds  Total Procedure Duration: 0 hours 10 minutes 30 seconds  Findings:                 The perianal and digital rectal examinations were                            normal.                           A 4 mm polyp was found in the transverse colon. The                            polyp was sessile. The polyp was removed with a                            cold biopsy forceps. Resection and retrieval were                            complete.                           There was evidence of a prior end-to-side  ileo-colonic anastomosis in the transverse colon.                            This was patent and was characterized by healthy                            appearing mucosa. The anastomosis was not traversed.                           Internal hemorrhoids were found during                            retroflexion. The hemorrhoids were small and Grade                            I (internal hemorrhoids that do not prolapse).                           The exam was otherwise without abnormality on                            direct and retroflexion views.                           A post polypectomy scar with adjacent tattoo was                            found in the sigmoid colon. Complications:            No immediate complications. Estimated blood loss:                            None. Estimated Blood Loss:     Estimated blood loss: none. Impression:               - One 4 mm polyp in the transverse colon,  removed                            with a cold biopsy forceps. Resected and retrieved.                           - Patent end-to-side ileo-colonic anastomosis,                            characterized by healthy appearing mucosa.                           - Tattoo and post polypectomy scar in sigmoid colon.                           - Internal hemorrhoids.                           - The examination was otherwise normal on direct  and retroflexion views. Recommendation:           - Repeat colonoscopy in 2 years for surveillance.                           - Patient has a contact number available for                            emergencies. The signs and symptoms of potential                            delayed complications were discussed with the                            patient. Return to normal activities tomorrow.                            Written discharge instructions were provided to the                            patient.                           - Resume previous diet.                           - Continue present medications.                           - Await pathology results. Ladene Artist, MD 11/27/2016 8:23:17 AM This report has been signed electronically.

## 2016-11-27 NOTE — Progress Notes (Signed)
A and O x3. Report to RN. Tolerated MAC anesthesia well.

## 2016-11-27 NOTE — Patient Instructions (Signed)
YOU HAD AN ENDOSCOPIC PROCEDURE TODAY AT [facility]:   Refer to the procedure report that was given to you for any specific questions about what was found during the examination.  If the procedure report does not answer your questions, please call your gastroenterologist to clarify.  If you requested that your care partner not be given the details of your procedure findings, then the procedure report has been included in a sealed envelope for you to review at your convenience later.  YOU SHOULD EXPECT: Some feelings of bloating in the abdomen. Passage of more gas than usual.  Walking can help get rid of the air that was put into your GI tract during the procedure and reduce the bloating. If you had a lower endoscopy (such as a colonoscopy or flexible sigmoidoscopy) you may notice spotting of blood in your stool or on the toilet paper. If you underwent a bowel prep for your procedure, you may not have a normal bowel movement for a few days.  Please Note:  You might notice some irritation and congestion in your nose or some drainage.  This is from the oxygen used during your procedure.  There is no need for concern and it should clear up in a day or so.  SYMPTOMS TO REPORT IMMEDIATELY:   Following lower endoscopy (colonoscopy or flexible sigmoidoscopy):  Excessive amounts of blood in the stool  Significant tenderness or worsening of abdominal pains  Swelling of the abdomen that is new, acute  Fever of 100F or higher   For urgent or emergent issues, a gastroenterologist can be reached at any hour by calling [phone].   DIET:  We do recommend a small meal at first, but then you may proceed to your regular diet.  Drink plenty of fluids but you should avoid alcoholic beverages for 24 hours.  ACTIVITY:  You should plan to take it easy for the rest of today and you should NOT DRIVE or use heavy machinery until tomorrow (because of the sedation medicines used during the test).     FOLLOW UP: Our staff will call the number listed on your records the next business day following your procedure to check on you and address any questions or concerns that you may have regarding the information given to you following your procedure. If we do not reach you, we will leave a message.  However, if you are feeling well and you are not experiencing any problems, there is no need to return our call.  We will assume that you have returned to your regular daily activities without incident.  If any biopsies were taken you will be contacted by phone or by letter within the next 1-3 weeks.  Please call us at [phone] if you have not heard about the biopsies in 3 weeks.    SIGNATURES/CONFIDENTIALITY: You and/or your care partner have signed paperwork which will be entered into your electronic medical record.  These signatures attest to the fact that that the information above on your After Visit Summary has been reviewed and is understood.  Full responsibility of the confidentiality of this discharge information lies with you and/or your care-partner.  Read all of the handouts given to you by your recovery room nurse.  We will see you again in two years for another colonoscopy.

## 2016-11-28 ENCOUNTER — Telehealth: Payer: Self-pay | Admitting: *Deleted

## 2016-11-28 NOTE — Telephone Encounter (Signed)
No answer, message left for the patient. 

## 2016-11-28 NOTE — Telephone Encounter (Signed)
Message left

## 2016-12-04 ENCOUNTER — Encounter: Payer: Self-pay | Admitting: Gastroenterology

## 2018-12-15 ENCOUNTER — Other Ambulatory Visit: Payer: Self-pay

## 2018-12-15 ENCOUNTER — Ambulatory Visit (AMBULATORY_SURGERY_CENTER): Payer: Self-pay | Admitting: *Deleted

## 2018-12-15 VITALS — Temp 98.4°F | Ht 67.0 in | Wt 158.0 lb

## 2018-12-15 DIAGNOSIS — Z86010 Personal history of colonic polyps: Secondary | ICD-10-CM

## 2018-12-15 DIAGNOSIS — Z85038 Personal history of other malignant neoplasm of large intestine: Secondary | ICD-10-CM

## 2018-12-15 MED ORDER — NA SULFATE-K SULFATE-MG SULF 17.5-3.13-1.6 GM/177ML PO SOLN: ORAL | 0 refills | Status: DC

## 2018-12-15 NOTE — Progress Notes (Signed)
Patient came in today for PV. Patient denies any allergies to eggs or soy. Patient denies any problems with anesthesia/sedation. Patient denies any oxygen use at home. Patient denies taking any diet/weight loss medications or blood thinners. Suprep coupon given to patient.  Pt is aware that care partner will wait in the car in parking lot; if they feel like they will be too hot to wait in the car; they may wait in the lobby.  We want them to wear a mask (we do not have any that we can provide them), practice social distancing, and we will check their temperatures when they get here.  I did remind patient that their care partner needs to stay in the parking lot the entire time. Pt will wear mask into building

## 2018-12-26 ENCOUNTER — Telehealth: Payer: Self-pay | Admitting: Gastroenterology

## 2018-12-26 NOTE — Telephone Encounter (Signed)

## 2018-12-29 ENCOUNTER — Other Ambulatory Visit: Payer: Self-pay

## 2018-12-29 ENCOUNTER — Ambulatory Visit (AMBULATORY_SURGERY_CENTER): Payer: Medicare Other | Admitting: Gastroenterology

## 2018-12-29 ENCOUNTER — Encounter: Payer: Self-pay | Admitting: Gastroenterology

## 2018-12-29 VITALS — BP 145/83 | HR 62 | Temp 97.5°F | Resp 13

## 2018-12-29 DIAGNOSIS — Z85038 Personal history of other malignant neoplasm of large intestine: Secondary | ICD-10-CM | POA: Diagnosis not present

## 2018-12-29 DIAGNOSIS — Z86010 Personal history of colonic polyps: Secondary | ICD-10-CM

## 2018-12-29 MED ORDER — SODIUM CHLORIDE 0.9 % IV SOLN: 500.0000 mL | Freq: Once | INTRAVENOUS | Status: DC

## 2018-12-29 NOTE — Telephone Encounter (Signed)
Patient answered "no" to all covid-19 questions.

## 2018-12-29 NOTE — Progress Notes (Signed)
To PACU, VSS. Report to [email or web address] 

## 2018-12-29 NOTE — Op Note (Signed)
[facility] Patient Name: James Krueger Procedure Date: 12/29/2018 12:23 PM MRN: [id number] Endoscopist: Ladene Artist , MD Age: 68 Referring MD:  Date of Birth: February 25, 1951 Gender: Male Account #: [id number] Procedure:                Colonoscopy Indications:              High risk colon cancer surveillance: Personal                            history of colon cancer. Personal history of                            adenomatous colon polyps. Medicines:                Monitored Anesthesia Care Procedure:                Pre-Anesthesia Assessment:                           - Prior to the procedure, a History and Physical                            was performed, and patient medications and                            allergies were reviewed. The patient's tolerance of                            previous anesthesia was also reviewed. The risks                            and benefits of the procedure and the sedation                            options and risks were discussed with the patient.                            All questions were answered, and informed consent                            was obtained. Prior Anticoagulants: The patient has                            taken no previous anticoagulant or antiplatelet                            agents. ASA Grade Assessment: II - A patient with                            mild systemic disease. After reviewing the risks                            and benefits, the patient was deemed in  satisfactory condition to undergo the procedure.                           After obtaining informed consent, the colonoscope                            was passed under direct vision. Throughout the                            procedure, the patient's blood pressure, pulse, and                            oxygen saturations were monitored continuously. The                            Colonoscope was introduced through the anus  and                            advanced to the ileocolonic anastomosis. The                            anastomosis and rectum were photographed. The                            quality of the bowel preparation was excellent. The                            colonoscopy was performed without difficulty. The                            patient tolerated the procedure well. Scope In: 1:06:55 PM Scope Out: 1:17:29 PM Scope Withdrawal Time: 0 hours 8 minutes 27 seconds  Total Procedure Duration: 0 hours 10 minutes 34 seconds  Findings:                 The perianal and digital rectal examinations were                            normal.                           There was evidence of a prior end-to-side                            ileo-colonic anastomosis in the transverse colon.                            This was patent and was characterized by healthy                            appearing mucosa. The anastomosis was not traversed.                           Internal hemorrhoids were found during  retroflexion. The hemorrhoids were medium-sized and                            Grade I (internal hemorrhoids that do not prolapse).                           The exam was otherwise without abnormality on                            direct and retroflexion views. Complications:            No immediate complications. Estimated blood loss:                            None. Estimated Blood Loss:     Estimated blood loss: none. Impression:               - Patent end-to-side ileo-colonic anastomosis,                            characterized by healthy appearing mucosa.                           - Internal hemorrhoids.                           - The examination was otherwise normal on direct                            and retroflexion views.                           - No specimens collected. Recommendation:           - Repeat colonoscopy in 2 years for surveillance.                            - Patient has a contact number available for                            emergencies. The signs and symptoms of potential                            delayed complications were discussed with the                            patient. Return to normal activities tomorrow.                            Written discharge instructions were provided to the                            patient.                           - Resume previous diet.                           -  Continue present medications. Ladene Artist, MD 12/29/2018 1:21:21 PM This report has been signed electronically.

## 2018-12-29 NOTE — Progress Notes (Signed)
Pt's states no medical or surgical changes since previsit or office visit. James Krueger-Covid

## 2018-12-29 NOTE — Patient Instructions (Addendum)
YOU HAD AN ENDOSCOPIC PROCEDURE TODAY AT THE [facility]:   Refer to the procedure report that was given to you for any specific questions about what was found during the examination.  If the procedure report does not answer your questions, please call your gastroenterologist to clarify.  If you requested that your care partner not be given the details of your procedure findings, then the procedure report has been included in a sealed envelope for you to review at your convenience later.  YOU SHOULD EXPECT: Some feelings of bloating in the abdomen. Passage of more gas than usual.  Walking can help get rid of the air that was put into your GI tract during the procedure and reduce the bloating. If you had a lower endoscopy (such as a colonoscopy or flexible sigmoidoscopy) you may notice spotting of blood in your stool or on the toilet paper. If you underwent a bowel prep for your procedure, you may not have a normal bowel movement for a few days.  Please Note:  You might notice some irritation and congestion in your nose or some drainage.  This is from the oxygen used during your procedure.  There is no need for concern and it should clear up in a day or so.  SYMPTOMS TO REPORT IMMEDIATELY:   Following lower endoscopy (colonoscopy or flexible sigmoidoscopy):  Excessive amounts of blood in the stool  Significant tenderness or worsening of abdominal pains  Swelling of the abdomen that is new, acute  Fever of 100F or higher   For urgent or emergent issues, a gastroenterologist can be reached at any hour by calling [phone].   DIET:  We do recommend a small meal at first, but then you may proceed to your regular diet.  Drink plenty of fluids but you should avoid alcoholic beverages for 24 hours.  MEDICATIONS: Continue present medications.  Please see handouts given to you by your recovery nurse.  ACTIVITY:  You should plan to take it easy for the rest of today and you should  NOT DRIVE or use heavy machinery until tomorrow (because of the sedation medicines used during the test).    FOLLOW UP: Our staff will call the number listed on your records 48-72 hours following your procedure to check on you and address any questions or concerns that you may have regarding the information given to you following your procedure. If we do not reach you, we will leave a message.  We will attempt to reach you two times.  During this call, we will ask if you have developed any symptoms of COVID 19. If you develop any symptoms (ie: fever, flu-like symptoms, shortness of breath, cough etc.) before then, please call [phone].  If you test positive for Covid 19 in the 2 weeks post procedure, please call and report this information to us.    If any biopsies were taken you will be contacted by phone or by letter within the next 1-3 weeks.  Please call us at [phone] if you have not heard about the biopsies in 3 weeks.   Thank you for allowing us to provide for your healthcare needs today.   SIGNATURES/CONFIDENTIALITY: You and/or your care partner have signed paperwork which will be entered into your electronic medical record.  These signatures attest to the fact that that the information above on your After Visit Summary has been reviewed and is understood.  Full responsibility of the confidentiality of this discharge information lies with you and/or   your care-partner. 

## 2018-12-31 ENCOUNTER — Telehealth: Payer: Self-pay

## 2018-12-31 NOTE — Telephone Encounter (Signed)
No answer, left message to call if having any issues or concerns, B.Mark Hassey RN 

## 2018-12-31 NOTE — Telephone Encounter (Signed)
Left voice mail

## 2021-05-01 ENCOUNTER — Ambulatory Visit (AMBULATORY_SURGERY_CENTER): Payer: Medicare Other | Admitting: *Deleted

## 2021-05-01 ENCOUNTER — Other Ambulatory Visit: Payer: Self-pay

## 2021-05-01 DIAGNOSIS — Z86010 Personal history of colonic polyps: Secondary | ICD-10-CM

## 2021-05-01 DIAGNOSIS — Z85038 Personal history of other malignant neoplasm of large intestine: Secondary | ICD-10-CM

## 2021-05-01 MED ORDER — NA SULFATE-K SULFATE-MG SULF 17.5-3.13-1.6 GM/177ML PO SOLN: ORAL | 0 refills | Status: DC

## 2021-05-01 NOTE — Progress Notes (Signed)
Virtual pre visit completed over telephone.  Instructions forwarded through MyChart and e-mail [email or web address]    No egg or soy allergy known to patient  No issues known to pt with past sedation with any surgeries or procedures Patient denies ever being told they had issues or difficulty with intubation  No FH of Malignant Hyperthermia Pt is not on diet pills Pt is not on  home 02  Pt is not on blood thinners  Pt denies issues with constipation  No A fib or A flutter  Pt is fully vaccinated  for Covid  Discussed with pt there will be an out-of-pocket cost for prep and that varies from $0 to 70 +  dollars - pt verbalized understanding   Due to the COVID-19 pandemic we are asking patients to follow certain guidelines in [facility] and the [facility]   Pt aware of COVID protocols and [facility] guidelines

## 2021-05-09 ENCOUNTER — Telehealth: Payer: Self-pay | Admitting: Gastroenterology

## 2021-05-09 DIAGNOSIS — Z85038 Personal history of other malignant neoplasm of large intestine: Secondary | ICD-10-CM

## 2021-05-09 DIAGNOSIS — Z86010 Personal history of colonic polyps: Secondary | ICD-10-CM

## 2021-05-09 MED ORDER — PLENVU 140 G PO SOLR: 1.0000 | Freq: Once | ORAL | 0 refills | Status: AC

## 2021-05-09 NOTE — Telephone Encounter (Signed)
Spoke with pt and he states Suprep will cost him $200.00  I attempted to call in Plenvu with Medicare coupon. The pharmacy is unable to run coupon.  I offered pt two options- to use [facility] and he could have instructions sent by MyChart. The other option is he could come into office and pick up Plenvu with new instructions.  He states he will pick up Plenvu.  He is aware we close at 2:00 pm on Wednesday.

## 2021-05-09 NOTE — Telephone Encounter (Signed)
Inbound call from patient states he have not received a coupon for Suprep for upcoming procedure 11/28

## 2021-05-15 ENCOUNTER — Ambulatory Visit (AMBULATORY_SURGERY_CENTER): Payer: Medicare Other | Admitting: Gastroenterology

## 2021-05-15 ENCOUNTER — Encounter: Payer: Self-pay | Admitting: Gastroenterology

## 2021-05-15 VITALS — BP 167/79 | HR 67 | Temp 98.4°F | Resp 14 | Ht 67.0 in | Wt 162.0 lb

## 2021-05-15 DIAGNOSIS — Z85038 Personal history of other malignant neoplasm of large intestine: Secondary | ICD-10-CM

## 2021-05-15 MED ORDER — SODIUM CHLORIDE 0.9 % IV SOLN: 500.0000 mL | Freq: Once | INTRAVENOUS | Status: DC

## 2021-05-15 NOTE — Progress Notes (Signed)
VS by CW  Pt's states no medical or surgical changes since previsit or office visit.  

## 2021-05-15 NOTE — Op Note (Signed)
[place] Patient Name: James Krueger Procedure Date: 05/15/2021 1:25 PM MRN: [id number] Endoscopist: Ladene Artist , MD Age: 70 Referring MD:  Date of Birth: 1950/08/15 Gender: Male Account #: [id number] Procedure:                Colonoscopy Indications:              High risk colon cancer surveillance: Personal                            history of colon cancer Medicines:                Monitored Anesthesia Care Procedure:                Pre-Anesthesia Assessment:                           - Prior to the procedure, a History and Physical                            was performed, and patient medications and                            allergies were reviewed. The patient's tolerance of                            previous anesthesia was also reviewed. The risks                            and benefits of the procedure and the sedation                            options and risks were discussed with the patient.                            All questions were answered, and informed consent                            was obtained. Prior Anticoagulants: The patient has                            taken no previous anticoagulant or antiplatelet                            agents. ASA Grade Assessment: II - A patient with                            mild systemic disease. After reviewing the risks                            and benefits, the patient was deemed in                            satisfactory condition to undergo the procedure.  After obtaining informed consent, the colonoscope                            was passed under direct vision. Throughout the                            procedure, the patient's blood pressure, pulse, and                            oxygen saturations were monitored continuously. The                            CF [id number] #[id number] was introduced through the anus                            and advanced to the the ileocolonic  anastomosis.                            The rectum was photographed. The quality of the                            bowel preparation was excellent. The colonoscopy                            was performed without difficulty. The patient                            tolerated the procedure well. Scope In: 1:43:41 PM Scope Out: 1:52:13 PM Scope Withdrawal Time: 0 hours 5 minutes 41 seconds  Total Procedure Duration: 0 hours 8 minutes 32 seconds  Findings:                 The perianal and digital rectal examinations were                            normal.                           There was evidence of a prior side-to-side                            ileo-colonic anastomosis in the transverse colon.                            This was patent and was characterized by healthy                            appearing mucosa. The anastomosis was not traversed.                           A tattoo was seen in the sigmoid colon. The tattoo                            site appeared normal.  Internal hemorrhoids were found during                            retroflexion. The hemorrhoids were small and Grade                            I (internal hemorrhoids that do not prolapse).                           The exam was otherwise without abnormality on                            direct and retroflexion views. Complications:            No immediate complications. Estimated blood loss:                            None. Estimated Blood Loss:     Estimated blood loss: none. Impression:               - Patent side-to-side ileo-colonic anastomosis,                            characterized by healthy appearing mucosa.                           - A tattoo was seen in the sigmoid colon. The                            tattoo site appeared normal.                           - Internal hemorrhoids.                           - The examination was otherwise normal on direct                             and retroflexion views.                           - No specimens collected. Recommendation:           - Repeat colonoscopy in 3 years for surveillance.                           - Patient has a contact number available for                            emergencies. The signs and symptoms of potential                            delayed complications were discussed with the                            patient. Return to normal activities tomorrow.  Written discharge instructions were provided to the                            patient.                           - Resume previous diet.                           - Continue present medications. Ladene Artist, MD 05/15/2021 1:59:50 PM This report has been signed electronically.

## 2021-05-15 NOTE — Patient Instructions (Signed)
Please read handouts provided. Continue present medications. Repeat colonoscopy in 3 years for screening.   YOU HAD AN ENDOSCOPIC PROCEDURE TODAY AT [facility]:   Refer to the procedure report that was given to you for any specific questions about what was found during the examination.  If the procedure report does not answer your questions, please call your gastroenterologist to clarify.  If you requested that your care partner not be given the details of your procedure findings, then the procedure report has been included in a sealed envelope for you to review at your convenience later.  YOU SHOULD EXPECT: Some feelings of bloating in the abdomen. Passage of more gas than usual.  Walking can help get rid of the air that was put into your GI tract during the procedure and reduce the bloating. If you had a lower endoscopy (such as a colonoscopy or flexible sigmoidoscopy) you may notice spotting of blood in your stool or on the toilet paper. If you underwent a bowel prep for your procedure, you may not have a normal bowel movement for a few days.  Please Note:  You might notice some irritation and congestion in your nose or some drainage.  This is from the oxygen used during your procedure.  There is no need for concern and it should clear up in a day or so.  SYMPTOMS TO REPORT IMMEDIATELY:  Following lower endoscopy (colonoscopy or flexible sigmoidoscopy):  Excessive amounts of blood in the stool  Significant tenderness or worsening of abdominal pains  Swelling of the abdomen that is new, acute  Fever of 100F or higher    For urgent or emergent issues, a gastroenterologist can be reached at any hour by calling [phone]. Do not use MyChart messaging for urgent concerns.    DIET:  We do recommend a small meal at first, but then you may proceed to your regular diet.  Drink plenty of fluids but you should avoid alcoholic beverages for 24 hours.  ACTIVITY:  You should  plan to take it easy for the rest of today and you should NOT DRIVE or use heavy machinery until tomorrow (because of the sedation medicines used during the test).    FOLLOW UP: Our staff will call the number listed on your records 48-72 hours following your procedure to check on you and address any questions or concerns that you may have regarding the information given to you following your procedure. If we do not reach you, we will leave a message.  We will attempt to reach you two times.  During this call, we will ask if you have developed any symptoms of COVID 19. If you develop any symptoms (ie: fever, flu-like symptoms, shortness of breath, cough etc.) before then, please call [phone].  If you test positive for Covid 19 in the 2 weeks post procedure, please call and report this information to [place].    If any biopsies were taken you will be contacted by phone or by letter within the next 1-3 weeks.  Please call us at [phone] if you have not heard about the biopsies in 3 weeks.    SIGNATURES/CONFIDENTIALITY: You and/or your care partner have signed paperwork which will be entered into your electronic medical record.  These signatures attest to the fact that that the information above on your After Visit Summary has been reviewed and is understood.  Full responsibility of the confidentiality of this discharge information lies with you and/or your care-partner.

## 2021-05-15 NOTE — Progress Notes (Signed)
To pacu, VSS. Report to [email or web address] 

## 2021-05-15 NOTE — Progress Notes (Signed)
History & Physical  Primary Care Physician:  Pcp, No Primary Gastroenterologist: Lucio Edward, MD  CHIEF COMPLAINT:  Personal history of colon cancer  HPI: James Krueger is a 70 y.o. male with a personal history of colon cancer x 2, S/P right hemicolectomy in 1996 and segmental colectomy in 2015, for surveillance colonoscopy.  Past Medical History:  Diagnosis Date   CAD (coronary artery disease)    Colon cancer ([place]) 1996/2014   Had chemo 1996-1997/ no radiation   Hyperlipidemia     Past Surgical History:  Procedure Laterality Date   CARDIAC CATHETERIZATION  04/16/2014   COLECTOMY  1996, 2014   right 1996   COLONOSCOPY  10/21/2015,11/27/2016   PARTIAL COLECTOMY N/A 08/19/2013   Procedure: PARTIAL COLECTOMY;  Surgeon: [name], MD;  Location: [facility];  Service: General;  Laterality: N/A;   POLYPECTOMY      Prior to Admission medications   Medication Sig Start Date End Date Taking? Authorizing Provider  Multiple Vitamins-Minerals (MULTIVITAMIN WITH MINERALS) tablet Take 1 tablet by mouth daily.   Yes [provider]    Current Outpatient Medications  Medication Sig Dispense Refill   Multiple Vitamins-Minerals (MULTIVITAMIN WITH MINERALS) tablet Take 1 tablet by mouth daily.     Current Facility-Administered Medications  Medication Dose Route Frequency Provider Last Rate Last Admin   0.9 %  sodium chloride infusion  500 mL Intravenous Once Ladene Artist, MD        Allergies as of 05/15/2021   (No Known Allergies)    Family History  Problem Relation Age of Onset   Colon cancer Father 11   Esophageal cancer Father    Colon cancer Brother 50   Pancreatic cancer Neg Hx    Stomach cancer Neg Hx    Heart disease Neg Hx    Diabetes Neg Hx    Colon polyps Neg Hx    Rectal cancer Neg Hx     Social History   Socioeconomic History   Marital status: Married    Spouse name: Not on file   Number of children: 2   Years of education: Not on file    Highest education level: Not on file  Occupational History   Occupation: SYSTEMS ANALYST    Employer: [employer]  Tobacco Use   Smoking status: Never   Smokeless tobacco: Never  Vaping Use   Vaping Use: Never used  Substance and Sexual Activity   Alcohol use: No    Alcohol/week: 0.0 standard drinks   Drug use: No   Sexual activity: Not on file  Other Topics Concern   Not on file  Social History Narrative   Not on file   Social Determinants of Health   Financial Resource Strain: Not on file  Food Insecurity: Not on file  Transportation Needs: Not on file  Physical Activity: Not on file  Stress: Not on file  Social Connections: Not on file  Intimate Partner Violence: Not on file    Review of Systems:  All systems reviewed an negative except where noted in HPI.  Gen: Denies any fever, chills, sweats, anorexia, fatigue, weakness, malaise, weight loss, and sleep disorder CV: Denies chest pain, angina, palpitations, syncope, orthopnea, PND, peripheral edema, and claudication. Resp: Denies dyspnea at rest, dyspnea with exercise, cough, sputum, wheezing, coughing up blood, and pleurisy. GI: Denies vomiting blood, jaundice, and fecal incontinence.   Denies dysphagia or odynophagia. GU : Denies urinary burning, blood in urine, urinary frequency, urinary hesitancy, nocturnal urination,  and urinary incontinence. MS: Denies joint pain, limitation of movement, and swelling, stiffness, low back pain, extremity pain. Denies muscle weakness, cramps, atrophy.  Derm: Denies rash, itching, dry skin, hives, moles, warts, or unhealing ulcers.  Psych: Denies depression, anxiety, memory loss, suicidal ideation, hallucinations, paranoia, and confusion. Heme: Denies bruising, bleeding, and enlarged lymph nodes. Neuro:  Denies any headaches, dizziness, paresthesias. Endo:  Denies any problems with DM, thyroid, adrenal function.   Physical Exam: General:  Alert, well-developed, in NAD Head:   Normocephalic and atraumatic. Eyes:  Sclera clear, no icterus.   Conjunctiva pink. Ears:  Normal auditory acuity. Mouth:  No deformity or lesions.  Neck:  Supple; no masses . Lungs:  Clear throughout to auscultation.   No wheezes, crackles, or rhonchi. No acute distress. Heart:  Regular rate and rhythm; no murmurs. Abdomen:  Soft, nondistended, nontender. No masses, hepatomegaly. No obvious masses.  Normal bowel .    Rectal:  Deferred   Msk:  Symmetrical without gross deformities.. Pulses:  Normal pulses noted. Extremities:  Without edema. Neurologic:  Alert and  oriented x4;  grossly normal neurologically. Skin:  Intact without significant lesions or rashes. Cervical Nodes:  No significant cervical adenopathy. Psych:  Alert and cooperative. Normal mood and affect.   Impression / Plan:   Personal history of colon cancer in 1996 and 2015, S/P right hemicolectomy in 1996 and segmental colectomy in 2015, for surveillance colonoscopy.   Pricilla Riffle. [place] Plan  05/15/2021, 1:37 PM See Shea Evans, [facility], to contact our on call provider

## 2021-05-17 ENCOUNTER — Telehealth: Payer: Self-pay

## 2021-05-17 NOTE — Telephone Encounter (Signed)
  Follow up Call-  Call back number 05/15/2021 12/29/2018  Post procedure Call Back phone  # [phone]  Permission to leave phone message Yes Yes  Some recent data might be hidden     Left message

## 2021-06-18 HISTORY — PX: ROTATOR CUFF REPAIR: SHX139

## 2024-04-02 ENCOUNTER — Encounter: Payer: Self-pay | Admitting: Gastroenterology

## 2024-04-09 ENCOUNTER — Ambulatory Visit: Admitting: Certified Registered"

## 2024-04-09 ENCOUNTER — Ambulatory Visit
Admission: RE | Admit: 2024-04-09 | Discharge: 2024-04-09 | Disposition: A | Discharge status: Home / Self Care | Attending: Gastroenterology | Admitting: Gastroenterology

## 2024-04-09 ENCOUNTER — Encounter: Admission: RE | Disposition: A | Payer: Self-pay | Source: Home / Self Care | Attending: Gastroenterology

## 2024-04-09 ENCOUNTER — Other Ambulatory Visit: Payer: Self-pay

## 2024-04-09 ENCOUNTER — Encounter: Payer: Self-pay | Admitting: Gastroenterology

## 2024-04-09 DIAGNOSIS — Z85038 Personal history of other malignant neoplasm of large intestine: Secondary | ICD-10-CM | POA: Diagnosis not present

## 2024-04-09 DIAGNOSIS — Z1211 Encounter for screening for malignant neoplasm of colon: Secondary | ICD-10-CM | POA: Insufficient documentation

## 2024-04-09 DIAGNOSIS — Z980 Intestinal bypass and anastomosis status: Secondary | ICD-10-CM | POA: Diagnosis not present

## 2024-04-09 HISTORY — DX: Essential (primary) hypertension: I10

## 2024-04-09 HISTORY — PX: COLONOSCOPY: SHX5424

## 2024-04-09 HISTORY — DX: Cardiomegaly: I51.7

## 2024-04-09 SURGERY — COLONOSCOPY
Anesthesia: General

## 2024-04-09 MED ORDER — PROPOFOL 500 MG/50ML IV EMUL
INTRAVENOUS | Status: DC | PRN
  Administered 2024-04-09: 120 ug/kg/min via INTRAVENOUS

## 2024-04-09 MED ORDER — LIDOCAINE 2% (20 MG/ML) 5 ML SYRINGE
INTRAMUSCULAR | Status: DC | PRN
  Administered 2024-04-09: 20 mg via INTRAVENOUS

## 2024-04-09 MED ORDER — PROPOFOL 10 MG/ML IV BOLUS
INTRAVENOUS | Status: DC | PRN
  Administered 2024-04-09: 30 mg via INTRAVENOUS
  Administered 2024-04-09: 70 mg via INTRAVENOUS

## 2024-04-09 MED ORDER — SODIUM CHLORIDE 0.9 % IV SOLN: INTRAVENOUS | Status: DC

## 2024-04-09 NOTE — Anesthesia Postprocedure Evaluation (Signed)
 Anesthesia Post Note  Patient: James Krueger.  Procedure(s) Performed: COLONOSCOPY  Patient location during evaluation: Endoscopy Anesthesia Type: General Level of consciousness: awake and alert Pain management: pain level controlled Vital Signs Assessment: post-procedure vital signs reviewed and stable Respiratory status: spontaneous breathing, nonlabored ventilation, respiratory function stable and patient connected to nasal cannula oxygen Cardiovascular status: blood pressure returned to baseline and stable Postop Assessment: no apparent nausea or vomiting Anesthetic complications: no   No notable events documented.   Last Vitals:  Vitals:   04/09/24 0844 04/09/24 0854  BP: 129/70 (!) 143/75  Pulse: (!) 58 (!) 55  Resp: 16 16  Temp:    SpO2: 100% 100%    Last Pain:  Vitals:   04/09/24 0854  TempSrc:   PainSc: 0-No pain                 Debby Mines

## 2024-04-09 NOTE — H&P (Signed)
 James Krueger , MD [address], [place], [place], [address] Phone: [phone] Fax: [phone]  Primary Care Physician:  James Lenis, MD   Pre-Procedure History & Physical: HPI:  James Krueger. is a 73 y.o. male is here for an colonoscopy.   Past Medical History:  Diagnosis Date   Adenocarcinoma of colon (HCC) 2015   CAD (coronary artery disease)    Colon cancer (HCC) 1996/2014   Had chemo 1996-1997/ no radiation   History of malignant neoplasm of large intestine 2010   Hyperlipidemia    Hypertension    LVH (left ventricular hypertrophy)     Past Surgical History:  Procedure Laterality Date   CARDIAC CATHETERIZATION  04/16/2014   COLECTOMY  1996, 2014   right 1996   COLONOSCOPY  10/21/2015,11/27/2016   COLONOSCOPY     21yr schedule since dx 1996   PARTIAL COLECTOMY N/A 08/19/2013   Procedure: PARTIAL COLECTOMY;  Surgeon: Krystal CHRISTELLA Spinner, MD;  Location: [facility];  Service: General;  Laterality: N/A;   POLYPECTOMY     ROTATOR CUFF REPAIR Right 2023    Prior to Admission medications   Medication Sig Start Date End Date Taking? Authorizing Provider  amLODipine (NORVASC) 10 MG tablet Take 10 mg by mouth daily.   Yes [provider]  aspirin  EC 81 MG tablet Take 81 mg by mouth daily. Swallow whole.   Yes [provider]  atorvastatin  (LIPITOR) 40 MG tablet Take 40 mg by mouth daily.   Yes [provider]  fluticasone (FLONASE) 50 MCG/ACT nasal spray Place 2 sprays into both nostrils daily.   Yes [provider]  lisinopril (ZESTRIL) 40 MG tablet Take 40 mg by mouth daily.   Yes [provider]  Multiple Vitamins-Minerals (MULTIVITAMIN WITH MINERALS) tablet Take 1 tablet by mouth daily.   Yes [provider]    Allergies as of 03/27/2024   (No Known Allergies)    Family History  Problem Relation Age of Onset   Colon cancer Father 77   Esophageal cancer Father    Colon cancer Brother 57    Pancreatic cancer Neg Hx    Stomach cancer Neg Hx    Heart disease Neg Hx    Diabetes Neg Hx    Colon polyps Neg Hx    Rectal cancer Neg Hx     Social History   Socioeconomic History   Marital status: Married    Spouse name: Not on file   Number of children: 2   Years of education: Not on file   Highest education level: Not on file  Occupational History   Occupation: SYSTEMS ANALYST    Employer: [employer]  Tobacco Use   Smoking status: Never   Smokeless tobacco: Never  Vaping Use   Vaping status: Never Used  Substance and Sexual Activity   Alcohol use: No    Alcohol/week: 0.0 standard drinks of alcohol   Drug use: No   Sexual activity: Not on file  Other Topics Concern   Not on file  Social History Narrative   Not on file   Social Drivers of Health   Financial Resource Strain: Low Risk  (03/06/2023)   Received from [facility]   Overall Financial Resource Strain (CARDIA)    Difficulty of Paying Living Expenses: Not hard at all  Food Insecurity: No Food Insecurity (03/06/2023)   Received from [facility]   Hunger Vital Sign    Within the  past 12 months, you worried that your food would run out before you got the money to buy more.: Never true    Within the past 12 months, the food you bought just didn't last and you didn't have money to get more.: Never true  Transportation Needs: No Transportation Needs (03/06/2023)   Received from [facility] - Transportation    In the past 12 months, has lack of transportation kept you from medical appointments or from getting medications?: No    Lack of Transportation (Non-Medical): No  Physical Activity: Not on file  Stress: Not on file  Social Connections: Not on file  Intimate Partner Violence: Not on file    Review of Systems: See HPI, otherwise negative ROS  Physical Exam: There were no vitals taken for this visit. General:   Alert,  pleasant and  cooperative in NAD Head:  Normocephalic and atraumatic. Neck:  Supple; no masses or thyromegaly. Lungs:  Clear throughout to auscultation, normal respiratory effort.    Heart:  +S1, +S2, Regular rate and rhythm, No edema. Abdomen:  Soft, nontender and nondistended. Normal bowel sounds, without guarding, and without rebound.   Neurologic:  Alert and  oriented x4;  grossly normal neurologically.  Impression/Plan: James Krueger. is here for an colonoscopy to be performed for surveillance due to prior history of colon cancer  Risks, benefits, limitations, and alternatives regarding  colonoscopy have been reviewed with the patient.  Questions have been answered.  All parties agreeable.   James Kung, MD  04/09/2024, 7:47 AM

## 2024-04-09 NOTE — Transfer of Care (Signed)
 Immediate Anesthesia Transfer of Care Note  Patient: James Krueger.  Procedure(s) Performed: COLONOSCOPY  Patient Location: Endoscopy Unit  Anesthesia Type:General  Level of Consciousness: drowsy  Airway & Oxygen Therapy: Patient Spontanous Breathing  Post-op Assessment: Report given to RN and Post -op Vital signs reviewed and stable  Post vital signs: Reviewed  Last Vitals:  Vitals Value Taken Time  BP 118/78   Temp    Pulse 55   Resp 14   SpO2 99     Last Pain:  Vitals:   04/09/24 0747  TempSrc: Temporal  PainSc: 0-No pain         Complications: No notable events documented.

## 2024-04-09 NOTE — Op Note (Signed)
 [facility] Patient Name: James Krueger Procedure Date: 04/09/2024 8:11 AM MRN: [id number] Account #: [id number] Date of Birth: 28-Jun-1950 Admit Type: Outpatient Age: 73 Room: [facility] ENDO ROOM 2 Gender: Male Note Status: Finalized Instrument Name: Colon Scope [id number] Procedure:             Colonoscopy Indications:           High risk colon cancer surveillance: Personal history                         of colon cancer, Last colonoscopy: November 2022 Providers:             Ruel Kung MD, MD Referring MD:          Alm HERO. Glover, MD (Referring MD) Medicines:             Monitored Anesthesia Care Complications:         No immediate complications. Procedure:             Pre-Anesthesia Assessment:                        - Prior to the procedure, a History and Physical was                         performed, and patient medications, allergies and                         sensitivities were reviewed. The patient's tolerance                         of previous anesthesia was reviewed.                        - The risks and benefits of the procedure and the                         sedation options and risks were discussed with the                         patient. All questions were answered and informed                         consent was obtained.                        - ASA Grade Assessment: II - A patient with mild                         systemic disease.                        After obtaining informed consent, the colonoscope was                         passed under direct vision. Throughout the procedure,                         the patient's blood pressure, pulse, and oxygen  saturations were monitored continuously. The                         Colonoscope was introduced through the anus and                         advanced to the the ileocolonic anastomosis. The                         colonoscopy was performed with ease. The  patient                         tolerated the procedure well. The quality of the bowel                         preparation was excellent. The ileocecal valve,                         appendiceal orifice, and rectum were photographed. Findings:      The perianal and digital rectal examinations were normal.      There was evidence of a prior end-to-side ileo-colonic anastomosis in       the transverse colon. This was patent and was characterized by healthy       appearing mucosa.      The entire examined colon appeared normal on direct and retroflexion       views. Impression:            - Patent end-to-side ileo-colonic anastomosis,                         characterized by healthy appearing mucosa.                        - The entire examined colon is normal on direct and                         retroflexion views.                        - No specimens collected. Recommendation:        - Discharge patient to home (with escort).                        - Resume previous diet.                        - Continue present medications.                        - Repeat colonoscopy in 3 years for surveillance. Procedure Code(s):     --- Professional ---                        [phone], Colonoscopy, flexible; diagnostic, including                         collection of specimen(s) by brushing or washing, when                         performed (separate procedure) Diagnosis Code(s):     ---  Professional ---                        S14.961, Personal history of other malignant neoplasm                         of large intestine                        Z98.0, Intestinal bypass and anastomosis status CPT copyright 2022 American [employer] Association. All rights reserved. The codes documented in this report are preliminary and upon coder review may  be revised to meet current compliance requirements. Ruel Kung, MD Ruel Kung MD, MD 04/09/2024 8:32:14 AM This report has been signed electronically. Number of  Addenda: 0 Note Initiated On: 04/09/2024 8:11 AM Scope Withdrawal Time: 0 hours 7 minutes 29 seconds  Total Procedure Duration: 0 hours 8 minutes 39 seconds  Estimated Blood Loss:  Estimated blood loss: none.      [facility]

## 2024-04-09 NOTE — Anesthesia Preprocedure Evaluation (Signed)
 Anesthesia Evaluation  Patient identified by MRN, date of birth, ID band Patient awake    Reviewed: Allergy & Precautions, H&P , NPO status , Patient's Chart, lab work & pertinent test results  Airway Mallampati: II  TM Distance: >3 FB Neck ROM: full    Dental no notable dental hx. (+) Teeth Intact, Dental Advisory Given   Pulmonary neg pulmonary ROS   Pulmonary exam normal breath sounds clear to auscultation       Cardiovascular Exercise Tolerance: Good hypertension, + CAD  Normal cardiovascular exam Rhythm:regular Rate:Normal     Neuro/Psych negative neurological ROS  negative psych ROS   GI/Hepatic negative GI ROS, Neg liver ROS,,,  Endo/Other  negative endocrine ROS    Renal/GU negative Renal ROS  negative genitourinary   Musculoskeletal   Abdominal   Peds  Hematology negative hematology ROS (+)   Anesthesia Other Findings Past Medical History: 2015: Adenocarcinoma of colon (HCC) No date: CAD (coronary artery disease) 1996/2014: Colon cancer (HCC)     Comment:  Had chemo 1996-1997/ no radiation 2010: History of malignant neoplasm of large intestine No date: Hyperlipidemia No date: Hypertension No date: LVH (left ventricular hypertrophy)  Past Surgical History: 04/16/2014: CARDIAC CATHETERIZATION 1996, 2014: COLECTOMY     Comment:  right 1996 10/21/2015,11/27/2016: COLONOSCOPY No date: COLONOSCOPY     Comment:  63yr schedule since dx 1996 08/19/2013: PARTIAL COLECTOMY; N/A     Comment:  Procedure: PARTIAL COLECTOMY;  Surgeon: Krystal CHRISTELLA Spinner,               MD;  Location: [facility];  Service: General;  Laterality:               N/A; No date: POLYPECTOMY 2023: ROTATOR CUFF REPAIR; Right  BMI    Body Mass Index: 24.31 kg/m      Reproductive/Obstetrics negative OB ROS                              Anesthesia Physical Anesthesia Plan  ASA: 2  Anesthesia Plan: General    Post-op Pain Management: Minimal or no pain anticipated   Induction: Intravenous  PONV Risk Score and Plan: 2 and Propofol  infusion and TIVA  Airway Management Planned: Nasal Cannula  Additional Equipment: None  Intra-op Plan:   Post-operative Plan:   Informed Consent: I have reviewed the patients History and Physical, chart, labs and discussed the procedure including the risks, benefits and alternatives for the proposed anesthesia with the patient or authorized representative who has indicated his/her understanding and acceptance.     Dental advisory given  Plan Discussed with: CRNA and Surgeon  Anesthesia Plan Comments: (Discussed risks of anesthesia with patient, including possibility of difficulty with spontaneous ventilation under anesthesia necessitating airway intervention, PONV, and rare risks such as cardiac or respiratory or neurological events, and allergic reactions. Discussed the role of CRNA in patient's perioperative care. Patient understands.)         Anesthesia Quick Evaluation
